# Patient Record
Sex: Female | Born: 1990 | Race: White | Hispanic: No | Marital: Single | Smoking: Never smoker
Health system: Southern US, Community
[De-identification: ages and names within clinical notes are randomized; demographics above are authoritative.]

## PROBLEM LIST (undated history)

## (undated) DIAGNOSIS — I10 Essential (primary) hypertension: Secondary | ICD-10-CM

## (undated) HISTORY — PX: ADENOIDECTOMY: SUR15

---

## 2011-12-26 ENCOUNTER — Ambulatory Visit: Payer: BC Managed Care – PPO | Admitting: Family Medicine

## 2011-12-26 VITALS — BP 120/75 | HR 67 | Temp 98.1°F | Resp 16 | Ht 68.5 in | Wt 212.4 lb

## 2011-12-26 DIAGNOSIS — L989 Disorder of the skin and subcutaneous tissue, unspecified: Secondary | ICD-10-CM

## 2011-12-26 DIAGNOSIS — R238 Other skin changes: Secondary | ICD-10-CM

## 2011-12-26 MED ORDER — HYDROXYZINE HCL 25 MG PO TABS: 25.0000 mg | ORAL_TABLET | Freq: Every evening | ORAL | Status: AC | PRN

## 2011-12-26 NOTE — Progress Notes (Signed)
  Subjective:    Patient ID: Isabel Brewer, female    DOB: 1991/06/10, 21 y.o.   MRN: [id number]  HPI 21 yo female with skin complaint. Tanned in tanning bed  Yesterday.  Stopped early, after 6 minutes, due to tingling feeling in her skin.  Has happened before when she's tanned.  Persisted yesterday - tingly and itchy.  Could't sleep.  UP all night because of it.  Took some benadryl today and has started to improve.  No rash.  No burn yet. Is fair skinned.  Has tanned before.  4th session this season.    Review of Systems Negative except as per HPI     Objective:   Physical Exam  Constitutional: She appears well-developed.  Pulmonary/Chest: Effort normal.  Neurological: She is alert.  Skin: Skin is warm. No rash noted. No erythema.          Assessment & Plan:  Skin paresthesias - UV reaction or possibly reaction to cleaning solution used on bed.  Advised against tanning.  Hydroxyzine if needed to help sleep tonight.  If persist past another 24 hours, follow-up.

## 2017-09-20 DIAGNOSIS — Z9889 Other specified postprocedural states: Secondary | ICD-10-CM

## 2017-09-20 HISTORY — DX: Other specified postprocedural states: Z98.890

## 2017-12-19 DIAGNOSIS — R87619 Unspecified abnormal cytological findings in specimens from cervix uteri: Secondary | ICD-10-CM

## 2017-12-19 HISTORY — DX: Unspecified abnormal cytological findings in specimens from cervix uteri: R87.619

## 2018-04-26 DIAGNOSIS — E282 Polycystic ovarian syndrome: Secondary | ICD-10-CM | POA: Insufficient documentation

## 2020-09-20 NOTE — L&D Delivery Note (Signed)
Delivery Note  Date of delivery: 12/30/2020 Estimated Date of Delivery: 02/03/21 Patient's last menstrual period was 04/09/2020. EGA: [redacted]w[redacted]d  Delivery Note At 2:20 PM a viable female was delivered via Vaginal, Spontaneous (Presentation:      LOA).  APGAR: 9, 10; weight  pending.  Placenta status: Spontaneous, Intact.  Cord: 3 vessels with the following complications: none.    First Stage: Labor onset: 0430 Augmentation : Pitocin Analgesia /Anesthesia intrapartum: Epidural SROM at 0430  Isabel Brewer presented to L&D with SROM and active labor. She was augmented with pitocin. Epidural placed.   Second Stage: Complete dilation at 1115 Onset of pushing at 1359 FHR second stage Cat II Delivery at 1420 on 12/30/2020  She progressed to complete and had a spontaneous vaginal birth of a live female over an intact perineum. The fetal head was delivered in OA position with restitution to LOA. No nuchal cord. Anterior then posterior shoulders delivered spontaneously. Baby placed on mom's abdomen and attended to by transition RN. Cord clamped and cut when pulseless by FOB. Cord blood obtained for newborn labs.  Third Stage: Placenta delivered intact with 3VC at 1424 Placenta disposition: Pathology Uterine tone firm / bleeding min IV pitocin given for hemorrhage prophylaxis  Anesthesia: Epidural Episiotomy: None Lacerations: 1st degree;Labial;Vaginal Suture Repair: 2.0 vicryl Est. Blood Loss (mL):   Complications: none  Mom to postpartum.  Baby to Couplet care / Skin to Skin.  Newborn: Birth Weight: pending  Apgar Scores: 9, 10 Feeding planned: Breast   Isabel Brewer, CNM 12/30/2020 2:45 PM

## 2020-12-30 ENCOUNTER — Inpatient Hospital Stay
Admission: EM | Admit: 2020-12-30 | Discharge: 2021-01-01 | DRG: 806 | Disposition: A | Discharge status: Home / Self Care | Payer: Medicaid Other

## 2020-12-30 ENCOUNTER — Inpatient Hospital Stay: Payer: Medicaid Other | Admitting: Anesthesiology

## 2020-12-30 ENCOUNTER — Other Ambulatory Visit: Payer: Self-pay

## 2020-12-30 ENCOUNTER — Encounter: Payer: Self-pay | Admitting: Obstetrics and Gynecology

## 2020-12-30 DIAGNOSIS — Z3A35 35 weeks gestation of pregnancy: Secondary | ICD-10-CM

## 2020-12-30 DIAGNOSIS — O99214 Obesity complicating childbirth: Secondary | ICD-10-CM | POA: Diagnosis present

## 2020-12-30 DIAGNOSIS — O1002 Pre-existing essential hypertension complicating childbirth: Principal | ICD-10-CM | POA: Diagnosis present

## 2020-12-30 DIAGNOSIS — O9081 Anemia of the puerperium: Secondary | ICD-10-CM | POA: Diagnosis not present

## 2020-12-30 DIAGNOSIS — O4700 False labor before 37 completed weeks of gestation, unspecified trimester: Secondary | ICD-10-CM | POA: Diagnosis present

## 2020-12-30 DIAGNOSIS — D62 Acute posthemorrhagic anemia: Secondary | ICD-10-CM | POA: Diagnosis not present

## 2020-12-30 DIAGNOSIS — Z20822 Contact with and (suspected) exposure to covid-19: Secondary | ICD-10-CM | POA: Diagnosis present

## 2020-12-30 DIAGNOSIS — O26893 Other specified pregnancy related conditions, third trimester: Secondary | ICD-10-CM | POA: Diagnosis present

## 2020-12-30 DIAGNOSIS — I10 Essential (primary) hypertension: Secondary | ICD-10-CM | POA: Diagnosis present

## 2020-12-30 DIAGNOSIS — O10919 Unspecified pre-existing hypertension complicating pregnancy, unspecified trimester: Secondary | ICD-10-CM | POA: Insufficient documentation

## 2020-12-30 HISTORY — DX: Essential (primary) hypertension: I10

## 2020-12-30 LAB — CHLAMYDIA/NGC RT PCR (ARMC ONLY)
Chlamydia Tr: NOT DETECTED
N gonorrhoeae: NOT DETECTED

## 2020-12-30 LAB — GROUP B STREP BY PCR: Group B strep by PCR: NEGATIVE

## 2020-12-30 LAB — COMPREHENSIVE METABOLIC PANEL
ALT: 12 U/L (ref 0–44)
AST: 19 U/L (ref 15–41)
Albumin: 3.1 g/dL — ABNORMAL LOW (ref 3.5–5.0)
Alkaline Phosphatase: 66 U/L (ref 38–126)
Anion gap: 12 (ref 5–15)
BUN: 5 mg/dL — ABNORMAL LOW (ref 6–20)
CO2: 20 mmol/L — ABNORMAL LOW (ref 22–32)
Calcium: 9.1 mg/dL (ref 8.9–10.3)
Chloride: 104 mmol/L (ref 98–111)
Creatinine, Ser: 0.5 mg/dL (ref 0.44–1.00)
GFR, Estimated: >60 mL/min (ref >60)
Glucose, Bld: 113 mg/dL — ABNORMAL HIGH (ref 70–99)
Potassium: 3.7 mmol/L (ref 3.5–5.1)
Sodium: 136 mmol/L (ref 135–145)
Total Bilirubin: 0.6 mg/dL (ref 0.3–1.2)
Total Protein: 7.3 g/dL (ref 6.5–8.1)

## 2020-12-30 LAB — RESP PANEL BY RT-PCR (FLU A&B, COVID) ARPGX2
Influenza A by PCR: NEGATIVE
Influenza B by PCR: NEGATIVE
SARS Coronavirus 2 by RT PCR: NEGATIVE

## 2020-12-30 LAB — WET PREP, GENITAL
Clue Cells Wet Prep HPF POC: NONE SEEN
Sperm: NONE SEEN
Trich, Wet Prep: NONE SEEN
Yeast Wet Prep HPF POC: NONE SEEN

## 2020-12-30 LAB — CBC
HCT: 34.4 % — ABNORMAL LOW (ref 36.0–46.0)
Hemoglobin: 11.4 g/dL — ABNORMAL LOW (ref 12.0–15.0)
MCH: 27.7 pg (ref 26.0–34.0)
MCHC: 33.1 g/dL (ref 30.0–36.0)
MCV: 83.7 fL (ref 80.0–100.0)
Platelets: 215 10*3/uL (ref 150–400)
RBC: 4.11 MIL/uL (ref 3.87–5.11)
RDW: 14.2 % (ref 11.5–15.5)
WBC: 19.2 10*3/uL — ABNORMAL HIGH (ref 4.0–10.5)
nRBC: 0 % (ref 0.0–0.2)

## 2020-12-30 LAB — RUPTURE OF MEMBRANE (ROM)PLUS: Rom Plus: NEGATIVE

## 2020-12-30 LAB — PROTEIN / CREATININE RATIO, URINE
Creatinine, Urine: 80 mg/dL
Protein Creatinine Ratio: 0.14 mg/mg{Cre} (ref 0.00–0.15)
Total Protein, Urine: 11 mg/dL

## 2020-12-30 LAB — TYPE AND SCREEN
ABO/RH(D): O POS
Antibody Screen: NEGATIVE

## 2020-12-30 LAB — ABO/RH: ABO/RH(D): O POS

## 2020-12-30 LAB — RPR: RPR Ser Ql: NONREACTIVE

## 2020-12-30 MED ORDER — SOD CITRATE-CITRIC ACID 500-334 MG/5ML PO SOLN: 30.0000 mL | ORAL | Status: DC | PRN

## 2020-12-30 MED ORDER — OXYTOCIN BOLUS FROM INFUSION
333.0000 mL | Freq: Once | INTRAVENOUS | Status: AC
  Administered 2020-12-30: 333 mL via INTRAVENOUS

## 2020-12-30 MED ORDER — NIFEDIPINE ER OSMOTIC RELEASE 30 MG PO TB24
30.0000 mg | ORAL_TABLET | Freq: Every day | ORAL | Status: DC
  Administered 2020-12-30 – 2021-01-01 (×3): 30 mg via ORAL
  Filled 2020-12-30 (×3): qty 1

## 2020-12-30 MED ORDER — FERROUS SULFATE 325 (65 FE) MG PO TABS
325.0000 mg | ORAL_TABLET | Freq: Two times a day (BID) | ORAL | Status: DC
  Administered 2020-12-31 – 2021-01-01 (×2): 325 mg via ORAL
  Filled 2020-12-30 (×2): qty 1

## 2020-12-30 MED ORDER — NIFEDIPINE 10 MG PO CAPS
30.0000 mg | ORAL_CAPSULE | Freq: Every morning | ORAL | Status: DC
  Filled 2020-12-30: qty 3

## 2020-12-30 MED ORDER — ONDANSETRON HCL 4 MG/2ML IJ SOLN: 4.0000 mg | Freq: Four times a day (QID) | INTRAMUSCULAR | Status: DC | PRN

## 2020-12-30 MED ORDER — DIBUCAINE (PERIANAL) 1 % EX OINT: 1.0000 "application " | TOPICAL_OINTMENT | CUTANEOUS | Status: DC | PRN

## 2020-12-30 MED ORDER — BUPIVACAINE HCL (PF) 0.25 % IJ SOLN
INTRAMUSCULAR | Status: DC | PRN
  Administered 2020-12-30 (×2): 4 mL via EPIDURAL

## 2020-12-30 MED ORDER — LACTATED RINGERS IV SOLN
500.0000 mL | Freq: Once | INTRAVENOUS | Status: AC
  Administered 2020-12-30: 500 mL via INTRAVENOUS

## 2020-12-30 MED ORDER — FENTANYL 2.5 MCG/ML W/ROPIVACAINE 0.15% IN NS 100 ML EPIDURAL (ARMC)
EPIDURAL | Status: AC
  Filled 2020-12-30: qty 100

## 2020-12-30 MED ORDER — PHENYLEPHRINE 40 MCG/ML (10ML) SYRINGE FOR IV PUSH (FOR BLOOD PRESSURE SUPPORT)
80.0000 ug | PREFILLED_SYRINGE | INTRAVENOUS | Status: DC | PRN
  Filled 2020-12-30: qty 10

## 2020-12-30 MED ORDER — CALCIUM CARBONATE ANTACID 500 MG PO CHEW: 2.0000 | CHEWABLE_TABLET | ORAL | Status: DC | PRN

## 2020-12-30 MED ORDER — OXYTOCIN-SODIUM CHLORIDE 30-0.9 UT/500ML-% IV SOLN
1.0000 m[IU]/min | INTRAVENOUS | Status: DC
  Administered 2020-12-30: 2 m[IU]/min via INTRAVENOUS

## 2020-12-30 MED ORDER — ACETAMINOPHEN 500 MG PO TABS: 1000.0000 mg | ORAL_TABLET | Freq: Four times a day (QID) | ORAL | Status: DC | PRN

## 2020-12-30 MED ORDER — LACTATED RINGERS IV SOLN
500.0000 mL | INTRAVENOUS | Status: DC | PRN
  Administered 2020-12-30: 500 mL via INTRAVENOUS

## 2020-12-30 MED ORDER — FENTANYL CITRATE (PF) 100 MCG/2ML IJ SOLN: 50.0000 ug | INTRAMUSCULAR | Status: DC | PRN

## 2020-12-30 MED ORDER — DIPHENHYDRAMINE HCL 50 MG/ML IJ SOLN: 12.5000 mg | INTRAMUSCULAR | Status: DC | PRN

## 2020-12-30 MED ORDER — OXYTOCIN 10 UNIT/ML IJ SOLN
INTRAMUSCULAR | Status: AC
  Filled 2020-12-30: qty 2

## 2020-12-30 MED ORDER — OXYCODONE HCL 5 MG PO TABS: 10.0000 mg | ORAL_TABLET | ORAL | Status: DC | PRN

## 2020-12-30 MED ORDER — SODIUM CHLORIDE 0.9 % IV SOLN
2.0000 g | Freq: Once | INTRAVENOUS | Status: AC
  Administered 2020-12-30: 2 g via INTRAVENOUS
  Filled 2020-12-30: qty 2000

## 2020-12-30 MED ORDER — FENTANYL 2.5 MCG/ML W/ROPIVACAINE 0.15% IN NS 100 ML EPIDURAL (ARMC)
12.0000 mL/h | EPIDURAL | Status: DC
Start: 2020-12-30 — End: 2020-12-30
  Administered 2020-12-30: 12 mL/h via EPIDURAL

## 2020-12-30 MED ORDER — OXYTOCIN-SODIUM CHLORIDE 30-0.9 UT/500ML-% IV SOLN
2.5000 [IU]/h | INTRAVENOUS | Status: DC
  Administered 2020-12-30 (×2): 2.5 [IU]/h via INTRAVENOUS
  Filled 2020-12-30 (×2): qty 500

## 2020-12-30 MED ORDER — DIPHENHYDRAMINE HCL 25 MG PO CAPS: 25.0000 mg | ORAL_CAPSULE | Freq: Four times a day (QID) | ORAL | Status: DC | PRN

## 2020-12-30 MED ORDER — EPHEDRINE 5 MG/ML INJ
10.0000 mg | INTRAVENOUS | Status: DC | PRN
  Filled 2020-12-30: qty 2

## 2020-12-30 MED ORDER — SODIUM CHLORIDE 0.9 % IV SOLN
1.0000 g | INTRAVENOUS | Status: DC
  Filled 2020-12-30 (×6): qty 1000

## 2020-12-30 MED ORDER — BENZOCAINE-MENTHOL 20-0.5 % EX AERO
1.0000 "application " | INHALATION_SPRAY | CUTANEOUS | Status: DC | PRN
  Administered 2020-12-30: 1 via TOPICAL
  Filled 2020-12-30: qty 56

## 2020-12-30 MED ORDER — TETANUS-DIPHTH-ACELL PERTUSSIS 5-2.5-18.5 LF-MCG/0.5 IM SUSY: 0.5000 mL | PREFILLED_SYRINGE | Freq: Once | INTRAMUSCULAR | Status: DC

## 2020-12-30 MED ORDER — WITCH HAZEL-GLYCERIN EX PADS: 1.0000 "application " | MEDICATED_PAD | CUTANEOUS | Status: DC | PRN

## 2020-12-30 MED ORDER — OXYCODONE HCL 5 MG PO TABS: 5.0000 mg | ORAL_TABLET | ORAL | Status: DC | PRN

## 2020-12-30 MED ORDER — ONDANSETRON HCL 4 MG PO TABS: 4.0000 mg | ORAL_TABLET | ORAL | Status: DC | PRN

## 2020-12-30 MED ORDER — MISOPROSTOL 200 MCG PO TABS
ORAL_TABLET | ORAL | Status: AC
  Filled 2020-12-30: qty 4

## 2020-12-30 MED ORDER — LIDOCAINE HCL (PF) 1 % IJ SOLN
INTRAMUSCULAR | Status: AC
  Filled 2020-12-30: qty 30

## 2020-12-30 MED ORDER — TERBUTALINE SULFATE 1 MG/ML IJ SOLN: 0.2500 mg | Freq: Once | INTRAMUSCULAR | Status: DC | PRN

## 2020-12-30 MED ORDER — LIDOCAINE-EPINEPHRINE (PF) 1.5 %-1:200000 IJ SOLN
INTRAMUSCULAR | Status: DC | PRN
  Administered 2020-12-30: 3 mL via PERINEURAL

## 2020-12-30 MED ORDER — DOCUSATE SODIUM 100 MG PO CAPS
100.0000 mg | ORAL_CAPSULE | Freq: Two times a day (BID) | ORAL | Status: DC
  Administered 2020-12-31 – 2021-01-01 (×3): 100 mg via ORAL
  Filled 2020-12-30 (×3): qty 1

## 2020-12-30 MED ORDER — ONDANSETRON HCL 4 MG/2ML IJ SOLN: 4.0000 mg | INTRAMUSCULAR | Status: DC | PRN

## 2020-12-30 MED ORDER — COCONUT OIL OIL: 1.0000 "application " | TOPICAL_OIL | Status: DC | PRN

## 2020-12-30 MED ORDER — LIDOCAINE HCL (PF) 1 % IJ SOLN
INTRAMUSCULAR | Status: DC | PRN
  Administered 2020-12-30: 3 mL

## 2020-12-30 MED ORDER — SIMETHICONE 80 MG PO CHEW: 80.0000 mg | CHEWABLE_TABLET | ORAL | Status: DC | PRN

## 2020-12-30 MED ORDER — AMMONIA AROMATIC IN INHA
RESPIRATORY_TRACT | Status: AC
  Filled 2020-12-30: qty 10

## 2020-12-30 MED ORDER — ACETAMINOPHEN 325 MG PO TABS: 650.0000 mg | ORAL_TABLET | ORAL | Status: DC | PRN

## 2020-12-30 MED ORDER — LACTATED RINGERS IV SOLN: INTRAVENOUS | Status: DC

## 2020-12-30 MED ORDER — IBUPROFEN 600 MG PO TABS
600.0000 mg | ORAL_TABLET | Freq: Four times a day (QID) | ORAL | Status: DC
  Administered 2020-12-30 – 2021-01-01 (×7): 600 mg via ORAL
  Filled 2020-12-30 (×7): qty 1

## 2020-12-30 MED ORDER — LIDOCAINE HCL (PF) 1 % IJ SOLN: 30.0000 mL | INTRAMUSCULAR | Status: DC | PRN

## 2020-12-30 MED ORDER — PRENATAL MULTIVITAMIN CH
1.0000 | ORAL_TABLET | Freq: Every day | ORAL | Status: DC
  Administered 2020-12-31 – 2021-01-01 (×2): 1 via ORAL
  Filled 2020-12-30 (×2): qty 1

## 2020-12-30 NOTE — Lactation Note (Signed)
This note was copied from a baby's chart. Lactation Consultation Note  Patient Name: Isabel Brewer [id number] Date: 12/30/2020 Reason for consult: L&D Initial assessment;1st time breastfeeding;Late-preterm 34-36.6wks Age:31 hours   Lactation at bedside in L&D to assist with first feeding post vaginal delivery to first time mom.Baby born at [redacted] weeks gestation, in football hold at R breast by Transition RN. Baby awake but not assertive at the breast. Mom has large heavy breasts with everted nipples, colostrum can easily be hand expressed.  In attempts to latch baby [facility] taught and demonstrated hand expression, and worked with baby at the breast. Baby could not sustain latch more than 3-4 sucks, possibly due to gestational age and size of mom's breasts. Towel placed under breast to help hold weight off of breasts, but baby still unable to maintain latch. LC continued to hand express colostrum into mouth, and then applied nipple shield. Baby accepted the breast well and sustained latch for 5 minutes with colostrum visible at end of feeding.  LC discussed with parents feeding patterns and behaviors of 35 week infants, discussed need for pumping post feedings/every 3 hours, and potential need for nipple shield while baby works to Chiropodist. Any expressed milk can be used as supplement if supplement is needed via pediatrician.  Parents verbalized understanding and on board with feeding plan: Put baby to breast on hand, hand express/pump every 2-3 hours, use of EBM post feeds as needed.  Pump to be set-up at bed side in MBU. Transition RN updated.   Maternal Data Has patient been taught Hand Expression?: Yes Does the patient have breastfeeding experience prior to this delivery?: No  Feeding Mother's Current Feeding Choice: Breast Milk  LATCH Score Latch: Repeated attempts needed to sustain latch, nipple held in mouth throughout feeding, stimulation needed to elicit  sucking reflex.  Audible Swallowing: A few with stimulation  Type of Nipple: Everted at rest and after stimulation  Comfort (Breast/Nipple): Soft / non-tender  Hold (Positioning): Assistance needed to correctly position infant at breast and maintain latch.  LATCH Score: 7   Lactation Tools Discussed/Used Tools: Nipple Shields Nipple shield size: 20  Interventions Interventions: Breast feeding basics reviewed;Assisted with latch;Skin to skin;Hand express;Breast massage;Breast compression;Adjust position;Education (nipple shield)  Discharge    Consult Status Consult Status: Follow-up Date: 12/30/20 Follow-up type: In-patient    Danford Bad 12/30/2020, 4:48 PM

## 2020-12-30 NOTE — Anesthesia Preprocedure Evaluation (Addendum)
Anesthesia Evaluation  Patient identified by MRN, date of birth, ID band Patient awake    Reviewed: Allergy & Precautions, H&P , NPO status , Patient's Chart, lab work & pertinent test results  Airway Mallampati: III  TM Distance: >3 FB Neck ROM: full    Dental no notable dental hx.    Pulmonary asthma ,    Pulmonary exam normal        Cardiovascular hypertension, Normal cardiovascular exam     Neuro/Psych negative neurological ROS  negative psych ROS   GI/Hepatic Neg liver ROS, GERD  ,  Endo/Other  negative endocrine ROS  Renal/GU negative Renal ROS  negative genitourinary   Musculoskeletal   Abdominal   Peds  Hematology negative hematology ROS (+)   Anesthesia Other Findings   Reproductive/Obstetrics (+) Pregnancy                            Anesthesia Physical Anesthesia Plan  ASA: II  Anesthesia Plan: Epidural   Post-op Pain Management:    Induction:   PONV Risk Score and Plan:   Airway Management Planned:   Additional Equipment:   Intra-op Plan:   Post-operative Plan:   Informed Consent: I have reviewed the patients History and Physical, chart, labs and discussed the procedure including the risks, benefits and alternatives for the proposed anesthesia with the patient or authorized representative who has indicated his/her understanding and acceptance.     Dental Advisory Given  Plan Discussed with: CRNA and Anesthesiologist  Anesthesia Plan Comments:         Anesthesia Quick Evaluation

## 2020-12-30 NOTE — Progress Notes (Signed)
Labor Progress Note  Isabel Brewer is a 30 y.o. G1P0 at [redacted]w[redacted]d by ultrasound admitted for augmentation after SROM.  Subjective: Assumed care.  Discussed POC with pt. She is comfortable with epidural.  Objective: BP (!) 112/50   Pulse (!) 111   Temp 98.3 F (36.8 C) (Oral)   Resp 18   Ht 5\' 10"  (1.778 m)   Wt 132.5 kg   LMP 04/09/2020   SpO2 97%   BMI 41.90 kg/m   Fetal Assessment: FHT:  FHR: 145 bpm, variability: moderate,  accelerations:  Present,  decelerations:  Absent Category/reactivity:  Category I UC:   regular, every 1-3 minutes SVE:    Dilation: 6 cm  Effacement: 80-90%  Station:  -1  Consistency: soft  Position: middle  Membrane status: SROM x 4 hours Amniotic color: clear  Labs: Lab Results  Component Value Date   WBC 19.2 (H) 12/30/2020   HGB 11.4 (L) 12/30/2020   HCT 34.4 (L) 12/30/2020   MCV 83.7 12/30/2020   PLT 215 12/30/2020    Assessment / Plan: Augmentation of labor due to SROM,  progressing well on pitocin - 62mU  Labor: Progressing on Pitocin, will continue to increase  Preeclampsia:  Chronic HTN, Currently on Procardia 30mg , last BP (after epidural) 112/50 Fetal Wellbeing:  Category I Pain Control:  Epidural I/D:  GBS neg, Afebrile, SROM x 4 hours Anticipated MOD:  NSVD  3m, CNM 12/30/2020, 8:36 AM

## 2020-12-30 NOTE — OB Triage Note (Addendum)
Pt is a 30y/o G1P0 at [redacted]w[redacted]d with c/o contractions that began Saturday and have gotten worse and every 6 minutes(since 2am) and LOF that began about 3am . Pt states +FM. Pt denies VB. Monitors applied and assessing. Initial [id number] .

## 2020-12-30 NOTE — Discharge Summary (Signed)
Obstetrical Discharge Summary  Patient Name: Isabel Brewer DOB: 11-Dec-1990 MRN: [id number]  Date of Admission: 12/30/2020 Date of Delivery: 12/30/20 Delivered by: Anselm Pancoast Date of Discharge: 01/01/2021  Primary OB: [facility]  [id number] last menstrual period was 04/09/2020. EDC Estimated Date of Delivery: 02/03/21 Gestational Age at Delivery: [redacted]w[redacted]d   Antepartum complications:  1. Chronic hypertension  2. Obesity in pregnancy  3. Elevated early 1 hour GTT - 3hr WNL  Admitting Diagnosis: Active labor Secondary Diagnosis: Patient Active Problem List   Diagnosis Date Noted  . Chronic hypertension affecting pregnancy 12/30/2020  . Chronic hypertension 12/30/2020  . NSVD (normal spontaneous vaginal delivery) 12/30/2020  . PCOS (polycystic ovarian syndrome) 04/26/2018    Augmentation: Pitocin Complications: None  Intrapartum complications/course: see delivery notes Delivery Type: spontaneous vaginal delivery Anesthesia: epidural Placenta: spontaneous Laceration: 1st degree and labial Episiotomy: none Newborn Data: Live born female  Birth Weight: 5#13 APGAR: 38, 10  Newborn Delivery   Birth date/time: 12/30/2020 14:20:00 Delivery type: Vaginal, Spontaneous      Postpartum Procedures: none  Post partum course:  Patient had an uncomplicated postpartum course.  By time of discharge on PPD#2, her pain was controlled on oral pain medications; she had appropriate lochia and was ambulating, voiding without difficulty and tolerating regular diet.  She was deemed stable for discharge to home.    Discharge Physical Exam:  BP 131/81 (BP Location: Left Arm)   Pulse 80   Temp 98.3 F (36.8 C)   Resp 17   Ht 5\' 10"  (1.778 m)   Wt 132.5 kg   LMP 04/09/2020   SpO2 98%   Breastfeeding Unknown   BMI 41.90 kg/m   General: alert and no distress Pulm: normal respiratory effort Lochia: appropriate Abdomen: soft, NT Uterine Fundus: firm, below  umbilicus Perineum: minimal edema, laceration repair well approximated Lochia: small Extremities: No evidence of DVT seen on physical exam. No lower extremity edema.  Edinburgh07/23/2021 Postnatal Depression Scale Screening Tool 12/31/2020  I have been able to laugh and see the funny side of things. 0  I have looked forward with enjoyment to things. 0  I have blamed myself unnecessarily when things went wrong. 2  I have been anxious or worried for no good reason. 2  I have felt scared or panicky for no good reason. 0  Things have been getting on top of me. 1  I have been so unhappy that I have had difficulty sleeping. 0  I have felt sad or miserable. 1  I have been so unhappy that I have been crying. 0  The thought of harming myself has occurred to me. 0  Edinburgh Postnatal Depression Scale Total 6     Labs: CBC Latest Ref Rng & Units 12/31/2020 12/30/2020  WBC 4.0 - 10.5 K/uL 14.9(H) 19.2(H)  Hemoglobin 12.0 - 15.0 g/dL 10.1(L) 11.4(L)  Hematocrit 36.0 - 46.0 % 30.1(L) 34.4(L)  Platelets 150 - 400 K/uL 191 215   O POS Performed at [facility], [address] [place], [place]  Hemoglobin  Date Value Ref Range Status  12/31/2020 10.1 (L) 12.0 - 15.0 g/dL Final   HCT  Date Value Ref Range Status  12/31/2020 30.1 (L) 36.0 - 46.0 % Final    Disposition: stable, discharge to home Baby Feeding: breastmilk Baby Disposition: home with mom  Contraception: Considering vasectomy  Prenatal Labs:  Blood type/Rh O pos  Antibody screen neg  Rubella Immune  Varicella Immune  RPR NR  HBsAg Neg  HIV NR  GC neg  Chlamydia neg  Genetic screening negative  1 hour GTT 147 (first trimester)   3 hour GTT 1st trimester - 99, 143, 130, 108 28 weeks - 102, 165, 133, 61  GBS Pending - collected 12/30/2020   Rh Immune globulin given: n/a Rubella vaccine given: Immune Varicella vaccine given: Immune Tdap vaccine given in AP or PP setting: 12/03/2020 Flu vaccine  given in AP or PP setting: 08/25/2020  Plan: Caryl Bis was discharged to home in good condition. Follow-up appointment with delivering provider in 6 weeks.  Discharge Instructions: Per After Visit Summary. Activity: Advance as tolerated. Pelvic rest for 6 weeks.   Diet: Regular Discharge Medications: Allergies as of 01/01/2021   No Known Allergies     Medication List    STOP taking these medications   aspirin 81 MG chewable tablet     TAKE these medications   benzocaine-Menthol 20-0.5 % Aero Commonly known as: DERMOPLAST Apply 1 application topically as needed for irritation (perineal discomfort).   coconut oil Oil Apply 1 application topically as needed.   docusate sodium 100 MG capsule Commonly known as: COLACE Take 1 capsule (100 mg total) by mouth 2 (two) times daily.   ferrous sulfate 325 (65 FE) MG tablet Take 1 tablet (325 mg total) by mouth 2 (two) times daily with a meal.   ibuprofen 600 MG tablet Commonly known as: ADVIL Take 1 tablet (600 mg total) by mouth every 6 (six) hours.   multivitamin-prenatal 27-0.8 MG Tabs tablet Take 1 tablet by mouth daily at 12 noon.   NIFEdipine 30 MG 24 hr tablet Commonly known as: ADALAT CC Take 30 mg by mouth daily.   simethicone 80 MG chewable tablet Commonly known as: MYLICON Chew 1 tablet (80 mg total) by mouth as needed for flatulence.   witch hazel-glycerin pad Commonly known as: TUCKS Apply 1 application topically as needed for hemorrhoids.      Outpatient follow up:   Follow-up Information    Haroldine Laws, CNM. Schedule an appointment as soon as possible for a visit in 6 week(s).   Specialty: Certified Nurse Midwife Why: routine postpartum Contact information: [address] [place] [address] [phone]               Signed: Randa Ngo, CNM  01/01/2021 11:24 AM

## 2020-12-30 NOTE — H&P (Signed)
OB History & Physical   History of Present Illness:   Chief Complaint: contractions and LOF  HPI:  Reghan Thul is a 30 y.o. G1P0 female at [redacted]w[redacted]d dated by [place] at [redacted]w[redacted]d.  She presents to L&D for contractions that have become more regular and LOF that started at 0300  Reports active fetal movement  Contractions: every 3 to 5 minutes  Starting at 0200 LOF/SROM: reports a gush of fluid that started at 0300 Vaginal bleeding: denies   Pregnancy Issues: 1. Chronic hypertension  2. Obesity in pregnancy  3. Elevated early 1 hour GTT - 3hr WNL  Patient Active Problem List   Diagnosis Date Noted  . Preterm contractions 12/30/2020  . Preterm labor 12/30/2020  . Chronic hypertension affecting pregnancy 12/30/2020  . PCOS (polycystic ovarian syndrome) 04/26/2018     Maternal Medical History:   Past Medical History:  Diagnosis Date  . Abnormal Pap smear of cervix 12/2017  . Chronic hypertension   . History of colposcopy 2019    Past Surgical History:  Procedure Laterality Date  . ADENOIDECTOMY      No Known Allergies  Prior to Admission medications   Medication Sig Start Date End Date Taking? Authorizing Provider  aspirin 81 MG chewable tablet Chew by mouth daily.   Yes [provider]  NIFEdipine (ADALAT CC) 30 MG 24 hr tablet Take 30 mg by mouth daily.   Yes [provider]  Prenatal Vit-Fe Fumarate-FA (MULTIVITAMIN-PRENATAL) 27-0.8 MG TABS tablet Take 1 tablet by mouth daily at 12 noon.   Yes [provider]     Prenatal care site:  [facility]  Social History: She  reports that she has never smoked. She has never used smokeless tobacco. She reports previous alcohol use. She reports that she does not use drugs.  Family History: family history includes Alcohol abuse in her father; Colon cancer in her maternal grandmother; Diabetes Mellitus II in her mother; Hypertension in her mother; Obesity in her mother; Uterine cancer in her  mother.   Review of Systems: A full review of systems was performed and negative except as noted in the HPI.     Physical Exam:  Vital Signs: BP (!) 143/79   Pulse (!) 104   Temp 98.3 F (36.8 C) (Oral)   Resp 18   Ht 5\' 10"  (1.778 m)   Wt 132.5 kg   LMP 04/09/2020   BMI 41.90 kg/m  Physical Exam  General: no acute distress.  HEENT: normocephalic, atraumatic Heart: regular rate & rhythm.  No murmurs/rubs/gallops Lungs: clear to auscultation bilaterally, normal respiratory effort Abdomen: soft, gravid, non-tender;   EFW: 2163g, 4lbs12oz, 58% on 12/11/2020 Pelvic:   External: Normal external female genitalia  Cervix: Dilation: 5.5 / Effacement (%): 90 / Station: Plus 1    Extremities: non-tender, symmetric, no edema bilaterally.  DTRs: 2+/2+  Neurologic: Alert & oriented x 3.    No results found for this or any previous visit (from the past 24 hour(s)).  Pertinent Results:  Prenatal Labs: Blood type/Rh O pos  Antibody screen neg  Rubella Immune  Varicella Immune  RPR NR  HBsAg Neg  HIV NR  GC neg  Chlamydia neg  Genetic screening negative  1 hour GTT 147 (first trimester)   3 hour GTT 1st trimester - 99, 143, 130, 108 28 weeks - 102, 165, 133, 61  GBS Pending - collected 12/30/2020   FHT: Baseline: 135 bpm, Variability: moderate, Accelerations: present and Decelerations: Absent TOCO: regular,  every 2-3 minutes SVE:  Dilation: 5.5 / Effacement (%): 90 / Station: Plus 1    Cephalic by Leopolds and SVE  -US performed yesterday, cephalic on [place]   No results found.  Assessment:  Vidhi Delellis is a 30 y.o. G1P0 female at [redacted]w[redacted]d with PTL and SROM.   Plan:  1. Admit to Labor & Delivery; consents reviewed and obtained - Covid admission screen   2. Fetal Well being  - Fetal Tracing: cat 1 - Group B Streptococcus ppx indicated: Pending, will start ampicillin for prophylaxis until PCR has resulted - Presentation: cehpalic confirmed by SVE   3. Routine OB: -  Prenatal labs reviewed, as above - Rh pos - CBC, T&S, RPR on admit - Clear fluids, IVF  4. Monitoring of labor  -  Contractions monitored with external toco -  Pelvis adequate for trial of labor  -  Plan for expectant management  -  Plan for  continuous fetal monitoring -  Maternal pain control as desired; planning regional anesthesia - Anticipate vaginal delivery  5. Post Partum Planning: - Infant feeding: breast - Contraception: Considering vasectomy - Tdap vaccine: given 12/03/2020 - Flu vaccine: given 08/25/2020  Gustavo Lah, CNM 12/30/20 5:57 AM  Margaretmary Eddy, CNM Certified Nurse Midwife [facility]

## 2020-12-30 NOTE — Progress Notes (Signed)
Labor Progress Note  Isabel Brewer is a 30 y.o. G1P0 at [redacted]w[redacted]d by ultrasound admitted for augmentation after SROM.  Subjective: Doing well, comfortable with epidural.  Objective: BP 119/79   Pulse 94   Temp 98.1 F (36.7 C) (Oral)   Resp 18   Ht 5\' 10"  (1.778 m)   Wt 132.5 kg   LMP 04/09/2020   SpO2 96%   BMI 41.90 kg/m   Fetal Assessment: FHT:  FHR: 135 bpm, variability: moderate,  accelerations:  Abscent,  decelerations:  Present Lates - resolved with position change, fluid bolus, and d/c pit Category/reactivity:  Category II UC:   regular, every 1-3 minutes SVE:    Dilation: 10 cm  Effacement: 100%  Station:  +1  Consistency: ---  Position: ---  Membrane status: SROM at 0430 Amniotic color: clear  Labs: Lab Results  Component Value Date   WBC 19.2 (H) 12/30/2020   HGB 11.4 (L) 12/30/2020   HCT 34.4 (L) 12/30/2020   MCV 83.7 12/30/2020   PLT 215 12/30/2020    Assessment / Plan: Augmentation of labor due to SROM,  progressing well on pitocin  Labor: Progressing on Pitocin, will continue to increase  Preeclampsia:  Chronic HTN, Currently on Procardia 30mg , last BP (after epidural) 112/50 Fetal Wellbeing:  Category II Pitocin turned off and IUPC placed Pain Control:  Epidural I/D:  GBS neg, Afebrile, SROM x 7 hours Anticipated MOD:  NSVD  03/01/2021, CNM 12/30/2020, 11:46 AM

## 2020-12-30 NOTE — Plan of Care (Signed)
Pt progressing with all general goals- labor and delivery goals and education met.

## 2020-12-30 NOTE — Anesthesia Procedure Notes (Addendum)
Epidural Patient location during procedure: OB Start time: 12/30/2020 7:39 AM  Staffing Anesthesiologist: Piscitello, Cleda Mccreedy, MD Resident/CRNA: Irving Burton, CRNA Performed: resident/CRNA   Preanesthetic Checklist Completed: patient identified, IV checked, site marked, risks and benefits discussed, surgical consent, monitors and equipment checked, pre-op evaluation and timeout performed  Epidural Patient position: sitting Prep: ChloraPrep Patient monitoring: heart rate, continuous pulse ox and blood pressure Approach: midline Location: L3-L4 Injection technique: LOR saline  Needle:  Needle type: Tuohy  Needle gauge: 17 G Needle length: 9 cm and 9 Needle insertion depth: 8 cm Catheter type: closed end flexible Catheter size: 19 Gauge Catheter at skin depth: 13 cm Test dose: negative and 1.5% lidocaine with Epi 1:200 K  Assessment Sensory level: T10 Events: blood not aspirated, injection not painful, no injection resistance, no paresthesia and negative IV test  Additional Notes 1 attempt Pt. Evaluated and documentation done after procedure finished. Patient identified. Risks/Benefits/Options discussed with patient including but not limited to bleeding, infection, nerve damage, paralysis, failed block, incomplete pain control, headache, blood pressure changes, nausea, vomiting, reactions to medication both or allergic, itching and postpartum back pain. Confirmed with bedside nurse the patient's most recent platelet count. Confirmed with patient that they are not currently taking any anticoagulation, have any bleeding history or any family history of bleeding disorders. Patient expressed understanding and wished to proceed. All questions were answered. Sterile technique was used throughout the entire procedure. Please see nursing notes for vital signs. Test dose was given through epidural catheter and negative prior to continuing to dose epidural or start infusion. Warning  signs of high block given to the patient including shortness of breath, tingling/numbness in hands, complete motor block, or any concerning symptoms with instructions to call for help. Patient was given instructions on fall risk and not to get out of bed. All questions and concerns addressed with instructions to call with any issues or inadequate analgesia.   Patient tolerated the insertion well without immediate complications.Reason for block:procedure for pain

## 2020-12-31 LAB — CBC
HCT: 30.1 % — ABNORMAL LOW (ref 36.0–46.0)
Hemoglobin: 10.1 g/dL — ABNORMAL LOW (ref 12.0–15.0)
MCH: 28.1 pg (ref 26.0–34.0)
MCHC: 33.6 g/dL (ref 30.0–36.0)
MCV: 83.6 fL (ref 80.0–100.0)
Platelets: 191 10*3/uL (ref 150–400)
RBC: 3.6 MIL/uL — ABNORMAL LOW (ref 3.87–5.11)
RDW: 14.5 % (ref 11.5–15.5)
WBC: 14.9 10*3/uL — ABNORMAL HIGH (ref 4.0–10.5)
nRBC: 0 % (ref 0.0–0.2)

## 2020-12-31 NOTE — Lactation Note (Signed)
This note was copied from a baby's chart. Lactation Consultation Note  Patient Name: Isabel Brewer Date: 12/31/2020 Reason for consult: Follow-up assessment;1st time breastfeeding;Late-preterm 34-36.6wks;Infant < 6lbs Age:31 hours  Lactation at bedside to assist with feeding for late preterm infant. Feeding plan to include breastfeeding, and providing 59mL EBM+formula(22 kcal).  LC assisted with position/latch of infant in football hold on R breast. Nipple shield placed. After a few attempts baby did accept nipple shield a few non-nutritive sucks identified at first. LC worked to massage and compress the breast tissue to move colostrum into the shield. Throughout 10 minutes at the breast 1 audible swallow was identified. Baby had brief periods of stronger suck pattern but did not last more than 3 sucks before returning to light/non-nutritive suck.  After 10 minutes, baby remained in side-lying position with pillow support for mom to offer supplement via bottle. Baby tolerated the feed well over 5 minutes.   LC and RN praised mom for baby's progress and moms dedication to feeding and providing for her baby.   Encouraged current feeding practices and behaviors, and continuation of pumping routine and process; reiterating the importance and reason behind consistency.  Encouraged to call for support or with questions/concerns.  Maternal Data Has patient been taught Hand Expression?: Yes Does the patient have breastfeeding experience prior to this delivery?: No  Feeding Mother's Current Feeding Choice: Breast Milk Nipple Type: Slow - flow  LATCH Score Latch: Repeated attempts needed to sustain latch, nipple held in mouth throughout feeding, stimulation needed to elicit sucking reflex.  Audible Swallowing: None (1 swallow noted throughout 10 minutes)  Type of Nipple: Everted at rest and after stimulation  Comfort (Breast/Nipple): Soft / non-tender  Hold (Positioning):  Assistance needed to correctly position infant at breast and maintain latch.  LATCH Score: 6   Lactation Tools Discussed/Used Tools: Pump;Nipple Shields Nipple shield size: 20 Breast pump type: Double-Electric Breast Pump Reason for Pumping: 35wk  Interventions Interventions: Breast feeding basics reviewed;Assisted with latch;Hand express;Breast massage;Breast compression;Support pillows;Education  Discharge    Consult Status Consult Status: Follow-up Date: 12/31/20 Follow-up type: In-patient    Danford Bad 12/31/2020, 2:47 PM

## 2020-12-31 NOTE — Progress Notes (Signed)
Post Partum Day 1 Subjective: Doing well, no complaints.  Tolerating regular diet, pain with PO meds, voiding and ambulating without difficulty.  No CP SOB Fever,Chills, N/V or leg pain; denies nipple or breast pain, no HA change of vision, RUQ/epigastric pain  Objective: BP 130/84 (BP Location: Left Arm)   Pulse 85   Temp (!) 97.5 F (36.4 C)   Resp 18   Ht 5\' 10"  (1.778 m)   Wt 132.5 kg   LMP 04/09/2020   SpO2 99%   Breastfeeding Unknown   BMI 41.90 kg/m    Physical Exam:  General: NAD Breasts: soft/nontender CV: RRR Pulm: nl effort, CTABL Abdomen: soft, NT, BS x 4 Perineum: minimal edema, laceration repair well approximated Lochia: small Uterine Fundus: fundus firm and 2 fb below umbilicus DVT Evaluation: no cords, ttp LEs   Recent Labs    12/30/20 0542 12/31/20 0518  HGB 11.4* 10.1*  HCT 34.4* 30.1*  WBC 19.2* 14.9*  PLT 215 191    Assessment/Plan: 30 y.o. G1P0101 postpartum day # 1  - Continue routine PP care - Lactation consult prn.  - Discussed contraceptive options including implant, IUDs hormonal and non-hormonal, injection, pills/ring/patch, condoms, and NFP. Undecided on birth control - Acute blood loss anemia - hemodynamically stable and asymptomatic; start po ferrous sulfate BID with stool softeners  - Immunization status: Needs all Imms up to date    Disposition: Does not desire Dc home today.     01/02/21, CNM 12/31/2020  3:19 PM

## 2020-12-31 NOTE — Lactation Note (Signed)
This note was copied from a baby's chart. Lactation Consultation Note  Patient Name: Isabel Brewer [facility] Date: 12/31/2020 Reason for consult: Follow-up assessment;Late-preterm 34-36.6wks;1st time breastfeeding Age:31 hours  Lactation at bedside to assist with attempted feeding and milk expression. Baby born at 35 weeks has had trouble maintaining body temperature, but has stable blood sugars. Preterm feeding protocol has been implemented and mom has initiated pumping once settled on MBU.   Baby has been receiving EBM via curved tip syringe at breast with help from the nursing staff overnight.   LC talked with parents about goals and feeding plan for today. Today we are going to be working on feeding/alertness at the breast and then supplementing with at least 58mL of EBM or Neosure 22kcal or a combination to equal 72mL.   With baby skin to skin with mom LC worked to attempt a feeding, baby remained sleepy and uninterested. After 10 minutes of attempting efforts were discontinued, and baby was handed to dad for skin to skin while mom pumped.  LC assisted mom with hand expression/breast stimulation and set-up of pump and then started the pumping process.  Baby taken to [facility] to obtain blood cultures per Neo's recommendation.  Encouraged to call out for support with each feeding, to ask questions, and to rest when able.   Maternal Data Has patient been taught Hand Expression?: Yes Does the patient have breastfeeding experience prior to this delivery?: No  Feeding Mother's Current Feeding Choice: Breast Milk  LATCH Score Latch: Too sleepy or reluctant, no latch achieved, no sucking elicited.  Audible Swallowing: None  Type of Nipple: Everted at rest and after stimulation  Comfort (Breast/Nipple): Soft / non-tender  Hold (Positioning): Assistance needed to correctly position infant at breast and maintain latch.  LATCH Score: 5   Lactation Tools Discussed/Used Tools:  Pump;Nipple Shields;26F feeding tube / Syringe Nipple shield size: 20 Breast pump type: Double-Electric Breast Pump Pump Education: Setup, frequency, and cleaning;Milk Storage Reason for Pumping: 35wk Pumping frequency: q 3hrs Pumped volume: 3 mL  Interventions Interventions: Breast feeding basics reviewed;Assisted with latch;Skin to skin;Hand express;Adjust position;Support pillows;Education;DEBP  Discharge    Consult Status Consult Status: Follow-up Date: 12/31/20 Follow-up type: In-patient    Danford Bad 12/31/2020, 11:07 AM

## 2020-12-31 NOTE — Anesthesia Postprocedure Evaluation (Signed)
Anesthesia Post Note  Patient: Isabel Brewer  Procedure(s) Performed: AN AD HOC LABOR EPIDURAL  Patient location during evaluation: Mother Baby Anesthesia Type: Epidural Level of consciousness: awake and alert Pain management: pain level controlled Vital Signs Assessment: post-procedure vital signs reviewed and stable Respiratory status: spontaneous breathing, nonlabored ventilation and respiratory function stable Cardiovascular status: stable Postop Assessment: no headache, no backache and epidural receding Anesthetic complications: no   No complications documented.   Last Vitals:  Vitals:   12/31/20 0752 12/31/20 1133  BP: (!) 141/76 130/84  Pulse: 88 85  Resp: 20 18  Temp: 36.6 C (!) 36.4 C  SpO2: 98% 99%    Last Pain:  Vitals:   12/31/20 0752  TempSrc: Oral  PainSc:                  Karoline Caldwell

## 2021-01-01 LAB — SURGICAL PATHOLOGY

## 2021-01-01 MED ORDER — SIMETHICONE 80 MG PO CHEW: 80.0000 mg | CHEWABLE_TABLET | ORAL | 0 refills | Status: AC | PRN

## 2021-01-01 MED ORDER — IBUPROFEN 600 MG PO TABS: 600.0000 mg | ORAL_TABLET | Freq: Four times a day (QID) | ORAL | 0 refills | Status: AC

## 2021-01-01 MED ORDER — BENZOCAINE-MENTHOL 20-0.5 % EX AERO: 1.0000 "application " | INHALATION_SPRAY | CUTANEOUS | Status: AC | PRN

## 2021-01-01 MED ORDER — FERROUS SULFATE 325 (65 FE) MG PO TABS: 325.0000 mg | ORAL_TABLET | Freq: Two times a day (BID) | ORAL | 0 refills | Status: AC

## 2021-01-01 MED ORDER — WITCH HAZEL-GLYCERIN EX PADS: 1.0000 "application " | MEDICATED_PAD | CUTANEOUS | 12 refills | Status: AC | PRN

## 2021-01-01 MED ORDER — DOCUSATE SODIUM 100 MG PO CAPS: 100.0000 mg | ORAL_CAPSULE | Freq: Two times a day (BID) | ORAL | 0 refills | Status: AC

## 2021-01-01 MED ORDER — COCONUT OIL OIL: 1.0000 "application " | TOPICAL_OIL | 0 refills | Status: AC | PRN

## 2021-01-01 NOTE — Discharge Instructions (Signed)
Postpartum Care After Vaginal Delivery The following information offers guidance about how to care for yourself from the time you deliver your baby to 6-12 weeks after delivery (postpartum period). If you have problems or questions, contact your health care provider for more specific instructions. Follow these instructions at home: Vaginal bleeding  It is normal to have vaginal bleeding (lochia) after delivery. Wear a sanitary pad for bleeding and discharge. ? During the first week after delivery, the amount and appearance of lochia is often similar to a menstrual period. ? Over the next few weeks, it will gradually decrease to a dry, yellow-brown discharge. ? For most women, lochia stops completely by 4-6 weeks after delivery, but can vary.  Change your sanitary pads frequently. Watch for any changes in your flow, such as: ? A sudden increase in volume. ? A change in color. ? Large blood clots.  If you pass a blood clot from your vagina, save it and call your health care provider. Do not flush blood clots down the toilet before talking with your health care provider.  Do not use tampons or douches until your health care provider approves.  If you are not breastfeeding, your period should return 6-8 weeks after delivery. If you are feeding your baby breast milk only, your period may not return until you stop breastfeeding. Perineal care  Keep the area between the vagina and the anus (perineum) clean and dry. Use medicated pads and pain-relieving sprays and creams as directed.  If you had a surgical cut in the perineum (episiotomy) or a tear, check the area for signs of infection until you are healed. Check for: ? More redness, swelling, or pain. ? Fluid or blood coming from the cut or tear. ? Warmth. ? Pus or a bad smell.  You may be given a squirt bottle to use instead of wiping to clean the perineum area after you use the bathroom. Pat the area gently to dry it.  To relieve pain  caused by an episiotomy, a tear, or swollen veins in the anus (hemorrhoids), take a warm sitz bath 2-3 times a day. In a sitz bath, the warm water should only come up to your hips and cover your buttocks.   Breast care  In the first few days after delivery, your breasts may feel heavy, full, and uncomfortable (breast engorgement). Milk may also leak from your breasts. Ask your health care provider about ways to help relieve the discomfort.  If you are breastfeeding: ? Wear a bra that supports your breasts and fits well. Use breast pads to absorb milk that leaks. ? Keep your nipples clean and dry. Apply creams and ointments as told. ? You may have uterine contractions every time you breastfeed for up to several weeks after delivery. This helps your uterus return to its normal size. ? If you have any problems with breastfeeding, notify your health care provider or lactation consultant.  If you are not breastfeeding: ? Avoid touching your breasts. Do not squeeze out (express) milk. Doing this can make your breasts produce more milk. ? Wear a good-fitting bra and use cold packs to help with swelling. Intimacy and sexuality  Ask your health care provider when you can engage in sexual activity. This may depend upon: ? Your risk of infection. ? How fast you are healing. ? Your comfort and desire to engage in sexual activity.  You are able to get pregnant after delivery, even if you have not had your period. Talk with   your health care provider about methods of birth control (contraception) or family planning if you desire future pregnancies. Medicines  Take over-the-counter and prescription medicines only as told by your health care provider.  Take an over-the-counter stool softener to help ease bowel movements as told by your health care provider.  If you were prescribed an antibiotic medicine, take it as told by your health care provider. Do not stop taking the antibiotic even if you start to  feel better.  Review all previous and current prescriptions to check for possible transfer into breast milk. Activity  Gradually return to your normal activities as told by your health care provider.  Rest as much as possible. Nap while your baby is sleeping. Eating and drinking  Drink enough fluid to keep your urine pale yellow.  To help prevent or relieve constipation, eat high-fiber foods every day.  Choose healthy eating to support breastfeeding or weight loss goals.  Take your prenatal vitamins until your health care provider tells you to stop.   General tips/recommendations  Do not use any products that contain nicotine or tobacco. These products include cigarettes, chewing tobacco, and vaping devices, such as e-cigarettes. If you need help quitting, ask your health care provider.  Do not drink alcohol, especially if you are breastfeeding.  Do not take medications or drugs that are not prescribed to you, especially if you are breastfeeding.  Visit your health care provider for a postpartum checkup within the first 3-6 weeks after delivery.  Complete a comprehensive postpartum visit no later than 12 weeks after delivery.  Keep all follow-up visits for you and your baby. Contact a health care provider if:  You feel unusually sad or worried.  Your breasts become red, painful, or hard.  You have a fever or other signs of an infection.  You have bleeding that is soaking through one pad an hour or you have blood clots.  You have a severe headache that doesn't go away or you have vision changes.  You have nausea and vomiting and are unable to eat or drink anything for 24 hours. Get help right away if:  You have chest pain or difficulty breathing.  You have sudden, severe leg pain.  You faint or have a seizure.  You have thoughts about hurting yourself or your baby. If you ever feel like you may hurt yourself or others, or have thoughts about taking your own life,  get help right away. Go to your nearest emergency department or:  Call your local emergency services (911 in the [place]  The National Suicide Prevention Lifeline at 1-800-273-8255. This suicide crisis helpline is open 24 hours a day.  Text the Crisis Text Line at 741741 (in the [place] Summary  The period of time after you deliver your newborn up to 6-12 weeks after delivery is called the postpartum period.  Keep all follow-up visits for you and your baby.  Review all previous and current prescriptions to check for possible transfer into breast milk.  Contact a health care provider if you feel unusually sad or worried during the postpartum period. This information is not intended to replace advice given to you by your health care provider. Make sure you discuss any questions you have with your health care provider. Document Revised: 05/22/2020 Document Reviewed: 05/22/2020 Else[employer] Patient Education  2021 [employer] Postpartum Baby Blues The postpartum period begins right after the birth of a baby. During this time, there is often joy and excitement. It is also a   time of many changes in the life of the parents. A mother may feel happy one minute and sad or stressed the next. These feelings of sadness, called the baby blues, usually happen in the period right after the baby is born and go away within a week or two. What are the causes? The exact cause of this condition is not known. Changes in hormone levels after childbirth are believed to trigger some of the symptoms. Other factors that can play a role in these mood changes include:  Lack of sleep.  Stressful life events, such as financial problems, caring for a loved one, or death of a loved one.  Genetics. What are the signs or symptoms? Symptoms of this condition include:  Changes in mood, such as going from extreme happiness to sadness.  A decrease in concentration.  Difficulty sleeping.  Crying spells and  tearfulness.  Loss of appetite.  Irritability.  Anxiety. If these symptoms last for more than 2 weeks or become more severe, you may have postpartum depression. How is this diagnosed? This condition is diagnosed based on an evaluation of your symptoms. Your health care provider may use a screening tool that includes a list of questions to help identify a person with the baby blues or postpartum depression. How is this treated? The baby blues usually go away on their own in 1-2 weeks. Social support is often what is needed. You will be encouraged to get adequate sleep and rest. Follow these instructions at home: Lifestyle  Get as much rest as you can. Take a nap when the baby sleeps.  Exercise regularly as told by your health care provider. Some women find yoga and walking to be helpful.  Eat a balanced and nourishing diet. This includes plenty of fruits and vegetables, whole grains, and lean proteins.  Do little things that you enjoy. Take a bubble bath, read your favorite magazine, or listen to your favorite music.  Avoid alcohol.  Ask for help with household chores, cooking, grocery shopping, or running errands. Do not try to do everything yourself. Consider hiring a postpartum doula to help. This is a professional who specializes in providing support to new mothers.  Try not to make any major life changes during pregnancy or right after giving birth. This can add stress.      General instructions  Talk to people close to you about how you are feeling. Get support from your partner, family members, friends, or other new moms. You may want to join a support group.  Find ways to manage stress. This may include: ? Writing your thoughts and feelings in a journal. ? Spending time outside. ? Spending time with people who make you laugh.  Try to stay positive in how you think. Think about the things you are grateful for.  Take over-the-counter and prescription medicines only as  told by your health care provider.  Let your health care provider know if you have any concerns.  Keep all postpartum visits. This is important. Contact a health care provider if:  Your baby blues do not go away after 2 weeks. Get help right away if:  You have thoughts of taking your own life (suicidal thoughts), or of harming your baby or someone else.  You see or hear things that are not there (hallucinations). If you ever feel like you may hurt yourself or others, or have thoughts about taking your own life, get help right away. Go to your nearest emergency department or:  Call   your local emergency services (911 in the [place]  Call a suicide crisis helpline, such as the National Suicide Prevention Lifeline, at 1-800-273-8255. This is open 24 hours a day in the [place]  Text the Crisis Text Line at 741741 (in the [place] Summary  After giving birth, you may feel happy one minute and sad or stressed the next. Feelings of sadness that happen right after the baby is born and go away after a week or two are called the baby blues.  You can manage the baby blues by getting enough rest, eating a healthy diet, exercising, spending time with supportive people, and finding ways to manage stress.  If feelings of sadness and stress last longer than 2 weeks or get in the way of caring for your baby, talk with your health care provider. This may mean you have postpartum depression. This information is not intended to replace advice given to you by your health care provider. Make sure you discuss any questions you have with your health care provider. Document Revised: 02/29/2020 Document Reviewed: 02/29/2020 Elsevier Patient Education  2021 [employer] Breastfeeding Tips for a Good Latch Latching is how your baby's mouth attaches to your nipple to breastfeed. It is an important part of breastfeeding. Your baby may have trouble latching for a number of reasons, such as:  Not being in the right  position.  Using a bottle or pacifier too early.  Problems within your baby's mouth, tongue, or lips.  The shape of your nipples.  Your baby being born early (prematurely). Small babies often have a weak suck.  Breasts becoming overfilled with milk (engorged breasts).  Express a little milk to help soften the breast. Work with a breastfeeding specialist (lactation consultant) to help your baby have a good latch. How does this affect me? A poor latch may cause you to have problems such as:  Cracked nipples.  Sore nipples.  Breasts becoming overfilled with milk  Plugged milk ducts.  Low milk supply.  Breast inflammation.  Breast infection. How does this affect my baby? A poor latch may cause your baby to not be able to feed well. As a result, he or she may have trouble gaining weight. Follow these instructions at home: How to position your baby  Find a comfortable place to sit or lie down. Your neck and back should be well supported.  If you are seated, place a pillow or rolled-up blanket under your baby. This will bring him or her to the level of your breast.  Make sure that your baby's belly is facing your belly.  Try different positions to find one that works best for you and your baby. How to help your baby latch  To start, you might find it helpful to gently rub your breast. Move your fingertips in a circle as you massage from your chest wall toward your nipple. This helps milk flow. Keep doing this during feeding if needed.  Position your breast. Hold your breast with four fingers underneath and your thumb above your nipple. Keep your fingers away from your nipple and your baby's mouth. Follow these steps to help your baby latch: 1. Rub your baby's lips gently with your finger or nipple. 2. When your baby's mouth is open wide enough, quickly bring your baby to your breast and place your whole nipple into your baby's mouth. Place as much of the colored area around  your nipple (areola)as possible into your baby's mouth. 3. Your baby's tongue should be between   his or her lower gum and your breast. 4. You should be able to see more areola above your baby's upper lip than below the lower lip. 5. When your baby starts sucking, you will feel a gentle pull on your nipple. You should not feel any pain. Be patient. It is common for a baby to suck for about 2-3 minutes to start the flow of breast milk. 6. Make sure that your baby's mouth is in the right position around your nipple. Your baby's lips should make a seal on your breast and be turned outward.   General instructions  Look for these signs that your baby has latched on to your nipple: ? The baby is quietly tugging or sucking without causing you pain. ? You hear the baby swallow after every 3 or 4 sucks. ? You see movement above and in front of the baby's ears while he or she is sucking.  Be aware of these signs that your baby has not latched on to your nipple: ? The baby makes sucking sounds or smacking sounds while feeding. ? You have nipple pain.  If your baby is not latched well, put your little finger between your baby's gums and your nipple. This will break the seal. Then try to help your baby latch again.  If you need help, get help from a breastfeeding specialist. Contact a doctor if:  You have cracking or soreness in your nipples that lasts longer than 1 week.  You have nipple pain.  Your breasts are filled with too much milk (engorgement), and this does not improve after 48-72 hours.  You have a plugged milk duct and a fever.  You follow the tips for a good latch but need more help.  You have a pus-like fluid coming from your breast.  Your baby is not gaining weight.  Your baby loses weight. Summary  Latching is how your baby's mouth attaches to your nipple to breastfeed.  Try different positions for breastfeeding to find one that works best for you and your baby.  A poor  latch may cause you to have cracked or sore nipples or other problems.  Work with a breastfeeding specialist (lactation consultant) to help your baby have a good latch. This information is not intended to replace advice given to you by your health care provider. Make sure you discuss any questions you have with your health care provider. Document Revised: 03/05/2020 Document Reviewed: 03/05/2020 Elsevier Patient Education  2021 [employer] Breastfeeding  Choosing to breastfeed is one of the best decisions you can make for yourself and your baby. A change in hormones during pregnancy causes your breasts to make breast milk in your milk-producing glands. Hormones prevent breast milk from being released before your baby is born. They also prompt milk flow after birth. Once breastfeeding has begun, thoughts of your baby, as well as his or her sucking or crying, can stimulate the release of milk from your milk-producing glands. Benefits of breastfeeding Research shows that breastfeeding offers many health benefits for infants and mothers. It also offers a cost-free and convenient way to feed your baby. For your baby  Your first milk (colostrum) helps your baby's digestive system to function better.  Special cells in your milk (antibodies) help your baby to fight off infections.  Breastfed babies are less likely to develop asthma, allergies, obesity, or type 2 diabetes. They are also at lower risk for sudden infant death syndrome (SIDS).  Nutrients in breast milk are better able   to meet your baby's needs compared to infant formula.  Breast milk improves your baby's brain development. For you  Breastfeeding helps to create a very special bond between you and your baby.  Breastfeeding is convenient. Breast milk costs nothing and is always available at the correct temperature.  Breastfeeding helps to burn calories. It helps you to lose the weight that you gained during  pregnancy.  Breastfeeding makes your uterus return faster to its size before pregnancy. It also slows bleeding (lochia) after you give birth.  Breastfeeding helps to lower your risk of developing type 2 diabetes, osteoporosis, rheumatoid arthritis, cardiovascular disease, and breast, ovarian, uterine, and endometrial cancer later in life. Breastfeeding basics Starting breastfeeding  Find a comfortable place to sit or lie down, with your neck and back well-supported.  Place a pillow or a rolled-up blanket under your baby to bring him or her to the level of your breast (if you are seated). Nursing pillows are specially designed to help support your arms and your baby while you breastfeed.  Make sure that your baby's tummy (abdomen) is facing your abdomen.  Gently massage your breast. With your fingertips, massage from the outer edges of your breast inward toward the nipple. This encourages milk flow. If your milk flows slowly, you may need to continue this action during the feeding.  Support your breast with 4 fingers underneath and your thumb above your nipple (make the letter "C" with your hand). Make sure your fingers are well away from your nipple and your baby's mouth.  Stroke your baby's lips gently with your finger or nipple.  When your baby's mouth is open wide enough, quickly bring your baby to your breast, placing your entire nipple and as much of the areola as possible into your baby's mouth. The areola is the colored area around your nipple. ? More areola should be visible above your baby's upper lip than below the lower lip. ? Your baby's lips should be opened and extended outward (flanged) to ensure an adequate, comfortable latch. ? Your baby's tongue should be between his or her lower gum and your breast.  Make sure that your baby's mouth is correctly positioned around your nipple (latched). Your baby's lips should create a seal on your breast and be turned out (everted).  It  is common for your baby to suck about 2-3 minutes in order to start the flow of breast milk. Latching Teaching your baby how to latch onto your breast properly is very important. An improper latch can cause nipple pain, decreased milk supply, and poor weight gain in your baby. Also, if your baby is not latched onto your nipple properly, he or she may swallow some air during feeding. This can make your baby fussy. Burping your baby when you switch breasts during the feeding can help to get rid of the air. However, teaching your baby to latch on properly is still the best way to prevent fussiness from swallowing air while breastfeeding. Signs that your baby has successfully latched onto your nipple  Silent tugging or silent sucking, without causing you pain. Infant's lips should be extended outward (flanged).  Swallowing heard between every 3-4 sucks once your milk has started to flow (after your let-down milk reflex occurs).  Muscle movement above and in front of his or her ears while sucking. Signs that your baby has not successfully latched onto your nipple  Sucking sounds or smacking sounds from your baby while breastfeeding.  Nipple pain. If you think   your baby has not latched on correctly, slip your finger into the corner of your baby's mouth to break the suction and place it between your baby's gums. Attempt to start breastfeeding again. Signs of successful breastfeeding Signs from your baby  Your baby will gradually decrease the number of sucks or will completely stop sucking.  Your baby will fall asleep.  Your baby's body will relax.  Your baby will retain a small amount of milk in his or her mouth.  Your baby will let go of your breast by himself or herself. Signs from you  Breasts that have increased in firmness, weight, and size 1-3 hours after feeding.  Breasts that are softer immediately after breastfeeding.  Increased milk volume, as well as a change in milk consistency  and color by the fifth day of breastfeeding.  Nipples that are not sore, cracked, or bleeding. Signs that your baby is getting enough milk  Wetting at least 1-2 diapers during the first 24 hours after birth.  Wetting at least 5-6 diapers every 24 hours for the first week after birth. The urine should be clear or pale yellow by the age of 5 days.  Wetting 6-8 diapers every 24 hours as your baby continues to grow and develop.  At least 3 stools in a 24-hour period by the age of 5 days. The stool should be soft and yellow.  At least 3 stools in a 24-hour period by the age of 7 days. The stool should be seedy and yellow.  No loss of weight greater than 10% of birth weight during the first 3 days of life.  Average weight gain of 4-7 oz (113-198 g) per week after the age of 4 days.  Consistent daily weight gain by the age of 5 days, without weight loss after the age of 2 weeks. After a feeding, your baby may spit up a small amount of milk. This is normal. Breastfeeding frequency and duration Frequent feeding will help you make more milk and can prevent sore nipples and extremely full breasts (breast engorgement). Breastfeed when you feel the need to reduce the fullness of your breasts or when your baby shows signs of hunger. This is called "breastfeeding on demand." Signs that your baby is hungry include:  Increased alertness, activity, or restlessness.  Movement of the head from side to side.  Opening of the mouth when the corner of the mouth or cheek is stroked (rooting).  Increased sucking sounds, smacking lips, cooing, sighing, or squeaking.  Hand-to-mouth movements and sucking on fingers or hands.  Fussing or crying. Avoid introducing a pacifier to your baby in the first 4-6 weeks after your baby is born. After this time, you may choose to use a pacifier. Research has shown that pacifier use during the first year of a baby's life decreases the risk of sudden infant death syndrome  (SIDS). Allow your baby to feed on each breast as long as he or she wants. When your baby unlatches or falls asleep while feeding from the first breast, offer the second breast. Because newborns are often sleepy in the first few weeks of life, you may need to awaken your baby to get him or her to feed. Breastfeeding times will vary from baby to baby. However, the following rules can serve as a guide to help you make sure that your baby is properly fed:  Newborns (babies 4 weeks of age or younger) may breastfeed every 1-3 hours.  Newborns should not go without breastfeeding   for longer than 3 hours during the day or 5 hours during the night.  You should breastfeed your baby a minimum of 8 times in a 24-hour period. Breast milk pumping Pumping and storing breast milk allows you to make sure that your baby is exclusively fed your breast milk, even at times when you are unable to breastfeed. This is especially important if you go back to work while you are still breastfeeding, or if you are not able to be present during feedings. Your lactation consultant can help you find a method of pumping that works best for you and give you guidelines about how long it is safe to store breast milk.      Caring for your breasts while you breastfeed Nipples can become dry, cracked, and sore while breastfeeding. The following recommendations can help keep your breasts moisturized and healthy:  Avoid using soap on your nipples.  Wear a supportive bra designed especially for nursing. Avoid wearing underwire-style bras or extremely tight bras (sports bras).  Air-dry your nipples for 3-4 minutes after each feeding.  Use only cotton bra pads to absorb leaked breast milk. Leaking of breast milk between feedings is normal.  Use lanolin on your nipples after breastfeeding. Lanolin helps to maintain your skin's normal moisture barrier. Pure lanolin is not harmful (not toxic) to your baby. You may also hand express a few  drops of breast milk and gently massage that milk into your nipples and allow the milk to air-dry. In the first few weeks after giving birth, some women experience breast engorgement. Engorgement can make your breasts feel heavy, warm, and tender to the touch. Engorgement peaks within 3-5 days after you give birth. The following recommendations can help to ease engorgement:  Completely empty your breasts while breastfeeding or pumping. You may want to start by applying warm, moist heat (in the shower or with warm, water-soaked hand towels) just before feeding or pumping. This increases circulation and helps the milk flow. If your baby does not completely empty your breasts while breastfeeding, pump any extra milk after he or she is finished.  Apply ice packs to your breasts immediately after breastfeeding or pumping, unless this is too uncomfortable for you. To do this: ? Put ice in a plastic bag. ? Place a towel between your skin and the bag. ? Leave the ice on for 20 minutes, 2-3 times a day.  Make sure that your baby is latched on and positioned properly while breastfeeding. If engorgement persists after 48 hours of following these recommendations, contact your health care provider or a lactation consultant. Overall health care recommendations while breastfeeding  Eat 3 healthy meals and 3 snacks every day. Well-nourished mothers who are breastfeeding need an additional 450-500 calories a day. You can meet this requirement by increasing the amount of a balanced diet that you eat.  Drink enough water to keep your urine pale yellow or clear.  Rest often, relax, and continue to take your prenatal vitamins to prevent fatigue, stress, and low vitamin and mineral levels in your body (nutrient deficiencies).  Do not use any products that contain nicotine or tobacco, such as cigarettes and e-cigarettes. Your baby may be harmed by chemicals from cigarettes that pass into breast milk and exposure to  secondhand smoke. If you need help quitting, ask your health care provider.  Avoid alcohol.  Do not use illegal drugs or marijuana.  Talk with your health care provider before taking any medicines. These include over-the-counter and prescription   medicines as well as vitamins and herbal supplements. Some medicines that may be harmful to your baby can pass through breast milk.  It is possible to become pregnant while breastfeeding. If birth control is desired, ask your health care provider about options that will be safe while breastfeeding your baby. Where to find more information: [employer]: www.llli.org Contact a health care provider if:  You feel like you want to stop breastfeeding or have become frustrated with breastfeeding.  Your nipples are cracked or bleeding.  Your breasts are red, tender, or warm.  You have: ? Painful breasts or nipples. ? A swollen area on either breast. ? A fever or chills. ? Nausea or vomiting. ? Drainage other than breast milk from your nipples.  Your breasts do not become full before feedings by the fifth day after you give birth.  You feel sad and depressed.  Your baby is: ? Too sleepy to eat well. ? Having trouble sleeping. ? More than 1 week old and wetting fewer than 6 diapers in a 24-hour period. ? Not gaining weight by 5 days of age.  Your baby has fewer than 3 stools in a 24-hour period.  Your baby's skin or the white parts of his or her eyes become yellow. Get help right away if:  Your baby is overly tired (lethargic) and does not want to wake up and feed.  Your baby develops an unexplained fever. Summary  Breastfeeding offers many health benefits for infant and mothers.  Try to breastfeed your infant when he or she shows early signs of hunger.  Gently tickle or stroke your baby's lips with your finger or nipple to allow the baby to open his or her mouth. Bring the baby to your breast. Make sure that much of  the areola is in your baby's mouth. Offer one side and burp the baby before you offer the other side.  Talk with your health care provider or lactation consultant if you have questions or you face problems as you breastfeed. This information is not intended to replace advice given to you by your health care provider. Make sure you discuss any questions you have with your health care provider. Document Revised: 12/01/2017 Document Reviewed: 10/08/2016 Elsevier Patient Education  2021 Elsevier Inc. Breast Pumping Tips Breast pumping is a way to get milk out of your breasts. You will then store the milk for your baby to use when you are away from home. There are three ways to pump.  You can use your hand to massage and squeeze your breast (hand expression).  You can use a hand-held machine to manually pump your milk.  You can use an electric machine to pump your milk. In the beginning you may not get much milk. After a few days, your breasts should make more. Pumping can help you start making milk after your baby is born. Pumping helps you to keep making milk when you are away from your baby. When should I pump? You can start pumping soon after your baby is born. Follow these tips:  When you are with your baby: ? Pump after you breastfeed. ? Pump from the free breast while you breastfeed.  When you are away from your baby: ? Pump every 2-3 hours for 15 minutes. ? Pump both breasts at the same time if you can.  If your baby drinks formula, pump around the time your baby gets the formula.  If you drank alcohol, wait 2 hours before you   pump.  If you are going to have surgery, ask your doctor when you should pump again. How do I get ready to pump? Try to relax. Try these things to help your milk come in:  Smell your baby's blanket or clothes.  Look at a picture or video of your baby.  Sit in a quiet, private space.  Place a cloth on your breast. The cloth should be warm and a little  wet.  Massage your breast and nipple.  Play relaxing music.  Picture your milk flowing.  Drink water and eat a snack. What are some tips? General tips for pumping breast milk  Always wash your hands with soap and water for at least 20 seconds before pumping.  If you do not get much milk or if pumping hurts, try different pump settings or a different kind of pump.  Drink enough fluid so your pee (urine) is clear or pale yellow.  Wear clothing that opens in the front or is easy to take off.  Pump milk into a clean bottle or container.  Do not smoke or use any products that contain nicotine or tobacco. If you need help quitting, ask your doctor.  Try to get a hands-free pumping bra, if possible. This makes it easy to pump breast milk. You can buy one or make your own.   Tips for storing breast milk  Store breast milk in a clean, BPA-free container. These include: ? A glass or plastic bottle. ? A milk storage bag.  Store only 2-4 ounces of breast milk in each container.  Swirl the breast milk in the container. Do not shake it.  Write down the date you pumped the milk on the container.  This is how long you can store breast milk: ? Room temperature: 6-8 hours. It is best to use the milk within 4 hours. ? Cooler with ice packs: 24 hours. ? Refrigerator: 5-8 days, if the milk is clean. It is best to use the milk within 3 days. ? Freezer: 9-12 months, if the milk is clean and stored away from the freezer door. It is best to use the milk within 6 months.  Put milk in the back of the refrigerator or freezer.  Thaw frozen milk using warm water. Do not use the microwave.   Tips for choosing a breast pump When choosing a pump, keep the following things in mind:  Manual breast pumps do not need electricity. They cost less. They can be hard to use.  Electric breast pumps use electricity. They are more expensive. They are easier to use. They collect more milk.  The suction cup  (flange) should be the right size.  Before you buy the pump, check if your insurance will pay for it. Tips for caring for a breast pump  Check the manual that came with your pump for cleaning tips.  Try not to touch the inside of pump parts.  Clean the pump after you use it. To do this: ? Wipe down the electrical part. Use a dry cloth or paper towel. Do not put this part in water or in cleaning products. ? Wash the plastic parts with soap and warm water. Or use the dishwasher if the manual says it is safe. You do not need to clean the tubing unless it touched breast milk. ? Let all the parts air dry. Avoid drying them with a cloth or towel. ? When the parts are clean and dry, put the pump back together. Then   store the pump.  If there is water in the tubing when you want to pump: 1. Attach the tubing to the pump. 2. Turn on the pump to dry the tubing. 3. Turn off the pump when the tube is dry. Summary  Pumping can help you start making milk after your baby is born. It lets you keep making milk when you are away from your baby.  When you are away from your baby, pump for about 15 minutes every 2-3 hours. Pump both breasts at the same time, if you can. This information is not intended to replace advice given to you by your health care provider. Make sure you discuss any questions you have with your health care provider. Document Revised: 06/17/2020 Document Reviewed: 06/17/2020 Elsevier Patient Education  2021 Elsevier Inc.  

## 2021-01-01 NOTE — Progress Notes (Signed)
Pt discharged.  Discharge instructions, prescriptions and follow up appointment given to and reviewed with pt. Pt verbalized understanding.  Infant will be transferred to pediatrics.

## 2021-01-01 NOTE — Lactation Note (Signed)
This note was copied from a baby's chart. Lactation Consultation Note  Patient Name: Isabel Brewer [id number] Date: 01/01/2021 Reason for consult: Follow-up assessment;1st time breastfeeding;Late-preterm 34-36.6wks;Infant < 6lbs Age:31 hours   Lactation follow-up. Baby increased intake volumes, maintained temperature overnight, however bilirubin is elevated.  Family desires discharge today, pending car seat test and circumcision.  Mom is pumping every 3 hours, volumes up to 60mL per session, baby consuming total of 17-77mL with remaining volume being 22 cal formula.  Mom has medicaid, will follow-up about pump obtainment, however LC encouraged enrollment in [facility] services for pump post discharge. LC and mom discussed importance of pumping routine and consistency in building supply, and continuation of working to get baby active at the breast. Educated mom on development of baby and alertness increasing as baby becomes closer to [redacted]wk gestation.  LC reviewed anticipated breast changes over the days to come, importance of complete emptying, breast and nipple care, engorgement and fullness management.   Information for outpatient lactation services and community breastfeeding support.  Family declines WIC referral at this time.  Maternal Data Has patient been taught Hand Expression?: Yes Does the patient have breastfeeding experience prior to this delivery?: No  Feeding Mother's Current Feeding Choice: Breast Milk  LATCH Score                    Lactation Tools Discussed/Used Tools: Pump;Nipple Shields Nipple shield size: 20 Breast pump type: Double-Electric Breast Pump Pump Education: Setup, frequency, and cleaning;Milk Storage Reason for Pumping: 35wk Pumping frequency: q 3h Pumped volume: 15 mL  Interventions Interventions: Breast feeding basics reviewed;Hand express;Education;DEBP  Discharge    Consult Status Consult Status: Follow-up Date:  01/01/21 Follow-up type: Call as needed    Danford Bad 01/01/2021, 10:19 AM

## 2021-01-02 ENCOUNTER — Ambulatory Visit: Payer: Self-pay

## 2021-01-02 NOTE — Lactation Note (Signed)
This note was copied from a baby's chart. Lactation Consultation Note  Patient Name: Boy [place] Dowd [id number] Date: 01/02/2021 Reason for consult: Follow-up assessment;Primapara;Late-preterm 34-36.6wks;Hyperbilirubinemia Age:31 hours  Maternal Data    Feeding Mother's Current Feeding Choice: Breast Milk Nipple Type: Slow - flow  LATCH Score                    Lactation Tools Discussed/Used Tools: Bottle Breast pump type: Double-Electric Breast Pump Reason for Pumping: provide EBM Pumping frequency: q2-3 hrs Pumped volume: 100 mL  Interventions Interventions: DEBP  Discharge Pump: DEBP;Rented (rented [facility] #[id number] for use after d/c x 1 wk) [facility] Program: No  Consult Status Consult Status: PRN Date: 01/02/21 Follow-up type: In-patient    Dyann Kief 01/02/2021, 4:45 PM

## 2021-01-25 ENCOUNTER — Other Ambulatory Visit: Payer: Self-pay | Admitting: Obstetrics and Gynecology

## 2021-01-25 NOTE — Progress Notes (Signed)
   History of Present Illness:  Chief Complaint: scheduled IOL for chronic HTN  HPI:  Isabel Brewer is a 30 y.o. G1P0000 female at [redacted]w[redacted]d dated by [place] at [redacted]w[redacted]d, inconsistent with LMP 04/09/20.  She presents to L&D for scheduled IOL due to [facility].    Pregnancy Issues: 1. Obesity - BMI 39 2. Elevated early 1hr GTT  1hr GTT 147  3hr GTT : 99, 143, 130, 108  Repeat 3hr GTT at 28 weeks: 102-165-133-61 3. Chronic HTN- dx early in preg  Started Procardia XL 30 MG 08/25/2020,low dose ASA 81 mg    Maternal Medical History:   Past Medical History:  Diagnosis Date  . Abnormal Pap smear of cervix 12/2017  . Chronic hypertension   . History of colposcopy 2019    Past Surgical History:  Procedure Laterality Date  . ADENOIDECTOMY      No Known Allergies  Prior to Admission medications   Medication Sig Start Date End Date Taking? Authorizing Provider  benzocaine-Menthol (DERMOPLAST) 20-0.5 % AERO Apply 1 application topically as needed for irritation (perineal discomfort). 01/01/21   Loyda Costin, Prudencio Pair, CNM  coconut oil OIL Apply 1 application topically as needed. 01/01/21   Summer Mccolgan, Prudencio Pair, CNM  docusate sodium (COLACE) 100 MG capsule Take 1 capsule (100 mg total) by mouth 2 (two) times daily. 01/01/21   Bettina Warn, Prudencio Pair, CNM  ferrous sulfate 325 (65 FE) MG tablet Take 1 tablet (325 mg total) by mouth 2 (two) times daily with a meal. 01/01/21   Ramiz Turpin, Prudencio Pair, CNM  ibuprofen (ADVIL) 600 MG tablet Take 1 tablet (600 mg total) by mouth every 6 (six) hours. 01/01/21   Rhiley Tarver, Prudencio Pair, CNM  NIFEdipine (ADALAT CC) 30 MG 24 hr tablet Take 30 mg by mouth daily.    [provider]  Prenatal Vit-Fe Fumarate-FA (MULTIVITAMIN-PRENATAL) 27-0.8 MG TABS tablet Take 1 tablet by mouth daily at 12 noon.    [provider]  simethicone (MYLICON) 80 MG chewable tablet Chew 1 tablet (80 mg total) by mouth as needed for flatulence. 01/01/21   Dajon Rowe, Prudencio Pair, CNM  witch hazel-glycerin  (TUCKS) pad Apply 1 application topically as needed for hemorrhoids. 01/01/21   Shekinah Pitones, Prudencio Pair, CNM     Prenatal care site: [facility]   Social History: She  reports that she has never smoked. She has never used smokeless tobacco. She reports previous alcohol use. She reports that she does not use drugs.  Family History: family history includes Alcohol abuse in her father; Colon cancer in her maternal grandmother; Diabetes Mellitus II in her mother; Hypertension in her mother; Obesity in her mother; Uterine cancer in her mother.    Pertinent Results:  Prenatal Labs: Blood type/Rh  O Pos  Antibody screen neg  Rubella Immune  Varicella Immune  RPR NR  HBsAg Neg  HIV NR  GC neg  Chlamydia neg  Genetic screening Negative materniT21  1 hour GTT  147  3 hour GTT 102-165-133-61  GBS  not done   Plan:  1. Admit to Labor & Delivery; consents reviewed and obtained - COVID swab on admit.  - GBS by PCR on admit.   Post Partum Planning: - Infant feeding: breast - Contraception: considering spouse vasectomy - Tdap: 12/03/20 - Flu: 08/25/20  Prudencio Pair Aryn Safran, CNM 01/25/21 10:26 PM

## 2021-01-26 NOTE — Progress Notes (Signed)
Toney Sang RN called pt at 0030, voicemail to phone not set up, pt not currently in hospital for 0000 induction of labor appointment. No other contact info found. Will attempt to contact pt again via provided phone number.

## 2021-03-04 ENCOUNTER — Inpatient Hospital Stay (HOSPITAL_COMMUNITY)
Admission: AD | Admit: 2021-03-04 | Discharge: 2021-03-07 | DRG: 419 | Disposition: A | Discharge status: Home / Self Care | Payer: Medicaid Other | Source: Other Acute Inpatient Hospital | Attending: Internal Medicine | Admitting: Internal Medicine

## 2021-03-04 ENCOUNTER — Encounter: Payer: Self-pay | Admitting: Intensive Care

## 2021-03-04 ENCOUNTER — Other Ambulatory Visit: Payer: Self-pay

## 2021-03-04 ENCOUNTER — Emergency Department
Admission: EM | Admit: 2021-03-04 | Discharge: 2021-03-04 | Disposition: A | Discharge status: Other Acute Inpatient Hospital | Payer: Medicaid Other | Attending: Emergency Medicine | Admitting: Emergency Medicine

## 2021-03-04 ENCOUNTER — Emergency Department: Payer: Medicaid Other

## 2021-03-04 DIAGNOSIS — K8060 Calculus of gallbladder and bile duct with cholecystitis, unspecified, without obstruction: Principal | ICD-10-CM | POA: Diagnosis present

## 2021-03-04 DIAGNOSIS — I10 Essential (primary) hypertension: Secondary | ICD-10-CM | POA: Insufficient documentation

## 2021-03-04 DIAGNOSIS — Z8249 Family history of ischemic heart disease and other diseases of the circulatory system: Secondary | ICD-10-CM

## 2021-03-04 DIAGNOSIS — R1011 Right upper quadrant pain: Secondary | ICD-10-CM | POA: Diagnosis present

## 2021-03-04 DIAGNOSIS — K219 Gastro-esophageal reflux disease without esophagitis: Secondary | ICD-10-CM | POA: Diagnosis not present

## 2021-03-04 DIAGNOSIS — K8050 Calculus of bile duct without cholangitis or cholecystitis without obstruction: Secondary | ICD-10-CM | POA: Diagnosis present

## 2021-03-04 DIAGNOSIS — E860 Dehydration: Secondary | ICD-10-CM | POA: Diagnosis present

## 2021-03-04 DIAGNOSIS — Z79899 Other long term (current) drug therapy: Secondary | ICD-10-CM

## 2021-03-04 DIAGNOSIS — R7401 Elevation of levels of liver transaminase levels: Secondary | ICD-10-CM | POA: Diagnosis not present

## 2021-03-04 DIAGNOSIS — Z20822 Contact with and (suspected) exposure to covid-19: Secondary | ICD-10-CM | POA: Diagnosis not present

## 2021-03-04 DIAGNOSIS — R109 Unspecified abdominal pain: Secondary | ICD-10-CM

## 2021-03-04 DIAGNOSIS — Z419 Encounter for procedure for purposes other than remedying health state, unspecified: Secondary | ICD-10-CM

## 2021-03-04 DIAGNOSIS — R7989 Other specified abnormal findings of blood chemistry: Secondary | ICD-10-CM

## 2021-03-04 LAB — COMPREHENSIVE METABOLIC PANEL
ALT: 198 U/L — ABNORMAL HIGH (ref 0–44)
AST: 436 U/L — ABNORMAL HIGH (ref 15–41)
Albumin: 4.3 g/dL (ref 3.5–5.0)
Alkaline Phosphatase: 98 U/L (ref 38–126)
Anion gap: 11 (ref 5–15)
BUN: 12 mg/dL (ref 6–20)
CO2: 26 mmol/L (ref 22–32)
Calcium: 9.6 mg/dL (ref 8.9–10.3)
Chloride: 102 mmol/L (ref 98–111)
Creatinine, Ser: 0.85 mg/dL (ref 0.44–1.00)
GFR, Estimated: >60 mL/min (ref >60)
Glucose, Bld: 107 mg/dL — ABNORMAL HIGH (ref 70–99)
Potassium: 4.3 mmol/L (ref 3.5–5.1)
Sodium: 139 mmol/L (ref 135–145)
Total Bilirubin: 3.7 mg/dL — ABNORMAL HIGH (ref 0.3–1.2)
Total Protein: 8.2 g/dL — ABNORMAL HIGH (ref 6.5–8.1)

## 2021-03-04 LAB — CBC
HCT: 36.9 % (ref 36.0–46.0)
Hemoglobin: 12.2 g/dL (ref 12.0–15.0)
MCH: 27.2 pg (ref 26.0–34.0)
MCHC: 33.1 g/dL (ref 30.0–36.0)
MCV: 82.4 fL (ref 80.0–100.0)
Platelets: 280 10*3/uL (ref 150–400)
RBC: 4.48 MIL/uL (ref 3.87–5.11)
RDW: 13.7 % (ref 11.5–15.5)
WBC: 9.7 10*3/uL (ref 4.0–10.5)
nRBC: 0 % (ref 0.0–0.2)

## 2021-03-04 LAB — URINALYSIS, COMPLETE (UACMP) WITH MICROSCOPIC
Bacteria, UA: NONE SEEN
Glucose, UA: NEGATIVE mg/dL
Hgb urine dipstick: NEGATIVE
Ketones, ur: NEGATIVE mg/dL
Leukocytes,Ua: NEGATIVE
Nitrite: NEGATIVE
Protein, ur: 30 mg/dL — AB
Specific Gravity, Urine: 1.032 — ABNORMAL HIGH (ref 1.005–1.030)
pH: 7 (ref 5.0–8.0)

## 2021-03-04 LAB — RESP PANEL BY RT-PCR (FLU A&B, COVID) ARPGX2
Influenza A by PCR: NEGATIVE
Influenza B by PCR: NEGATIVE
SARS Coronavirus 2 by RT PCR: NEGATIVE

## 2021-03-04 LAB — POC URINE PREG, ED: Preg Test, Ur: NEGATIVE

## 2021-03-04 LAB — LIPASE, BLOOD: Lipase: 28 U/L (ref 11–51)

## 2021-03-04 MED ORDER — SODIUM CHLORIDE 0.9 % IV BOLUS
1000.0000 mL | Freq: Once | INTRAVENOUS | Status: AC
  Administered 2021-03-04: 1000 mL via INTRAVENOUS

## 2021-03-04 MED ORDER — FENTANYL CITRATE (PF) 100 MCG/2ML IJ SOLN
50.0000 ug | Freq: Once | INTRAMUSCULAR | Status: AC
Start: 2021-03-04 — End: 2021-03-04
  Administered 2021-03-04: 50 ug via INTRAVENOUS
  Filled 2021-03-04: qty 2

## 2021-03-04 MED ORDER — FENTANYL CITRATE (PF) 100 MCG/2ML IJ SOLN: 50.0000 ug | Freq: Once | INTRAMUSCULAR | Status: DC

## 2021-03-04 NOTE — ED Provider Notes (Signed)
[facility] Emergency Department Provider Note   ____________________________________________   I have reviewed the triage vital signs and the nursing notes.   HISTORY  Chief Complaint Abdominal Pain   History limited by: Not Limited   HPI Isabel Brewer is a 30 y.o. female who presents to the emergency department today because of concern for abdominal pain. Located in the right upper quadrant. Started last night shortly after eating and had been constant until just a few hours ago when it started to get better. The patient states that she is not having any pain at the time of my exam. She did have an episode of vomiting this morning. The patient denies any shortness of breath or chest pain. Did have similar pain occasionally during a recent pregnancy, delivered 2 months ago. Did think it might be related to heart burn. Mother did have gallbladder removed.    Records reviewed. Per medical record review patient has a history of HTN.  Past Medical History:  Diagnosis Date   Abnormal Pap smear of cervix 12/2017   Chronic hypertension    History of colposcopy 2019    Patient Active Problem List   Diagnosis Date Noted   Chronic hypertension affecting pregnancy 12/30/2020   Chronic hypertension 12/30/2020   NSVD (normal spontaneous vaginal delivery) 12/30/2020   PCOS (polycystic ovarian syndrome) 04/26/2018    Past Surgical History:  Procedure Laterality Date   ADENOIDECTOMY      Prior to Admission medications   Medication Sig Start Date End Date Taking? Authorizing Provider  benzocaine-Menthol (DERMOPLAST) 20-0.5 % AERO Apply 1 application topically as needed for irritation (perineal discomfort). 01/01/21   McVey, Prudencio Pair, CNM  coconut oil OIL Apply 1 application topically as needed. 01/01/21   McVey, Prudencio Pair, CNM  docusate sodium (COLACE) 100 MG capsule Take 1 capsule (100 mg total) by mouth 2 (two) times daily. 01/01/21   McVey, Prudencio Pair, CNM   ferrous sulfate 325 (65 FE) MG tablet Take 1 tablet (325 mg total) by mouth 2 (two) times daily with a meal. 01/01/21   McVey, Prudencio Pair, CNM  ibuprofen (ADVIL) 600 MG tablet Take 1 tablet (600 mg total) by mouth every 6 (six) hours. 01/01/21   McVey, Prudencio Pair, CNM  NIFEdipine (ADALAT CC) 30 MG 24 hr tablet Take 30 mg by mouth daily.    [provider]  Prenatal Vit-Fe Fumarate-FA (MULTIVITAMIN-PRENATAL) 27-0.8 MG TABS tablet Take 1 tablet by mouth daily at 12 noon.    [provider]  simethicone (MYLICON) 80 MG chewable tablet Chew 1 tablet (80 mg total) by mouth as needed for flatulence. 01/01/21   McVey, Prudencio Pair, CNM  witch hazel-glycerin (TUCKS) pad Apply 1 application topically as needed for hemorrhoids. 01/01/21   McVey, Prudencio Pair, CNM    Allergies Patient has no known allergies.  Family History  Problem Relation Age of Onset   Alcohol abuse Father    Colon cancer Maternal Grandmother    Diabetes Mellitus II Mother    Hypertension Mother    Obesity Mother    Uterine cancer Mother     Social History Social History   Tobacco Use   Smoking status: Never   Smokeless tobacco: Never  Substance Use Topics   Alcohol use: Not Currently   Drug use: Never    Review of Systems Constitutional: No fever/chills Eyes: No visual changes. ENT: No sore throat. Cardiovascular: Denies chest pain. Respiratory: Denies shortness of breath. Gastrointestinal: Positive for RUQ pain. Positive  for episode of vomiting.  Genitourinary: Negative for dysuria. Musculoskeletal: Negative for back pain. Skin: Negative for rash. Neurological: Negative for headaches, focal weakness or numbness.  ____________________________________________   PHYSICAL EXAM:  VITAL SIGNS: ED Triage Vitals  Enc Vitals Group     BP 03/04/21 1431 132/85     Pulse Rate 03/04/21 1431 85     Resp 03/04/21 1704 18     Temp 03/04/21 1431 98 F (36.7 C)     Temp Source 03/04/21 1431 Oral     SpO2  03/04/21 1431 98 %     Weight 03/04/21 1431 261 lb (118.4 kg)     Height 03/04/21 1431 5\' 10"  (1.778 m)     Head Circumference --      Peak Flow --      Pain Score 03/04/21 1431 7    Constitutional: Alert and oriented.  Eyes: Conjunctivae are normal.  ENT      Head: Normocephalic and atraumatic.      Nose: No congestion/rhinnorhea.      Mouth/Throat: Mucous membranes are moist.      Neck: No stridor. Hematological/Lymphatic/Immunilogical: No cervical lymphadenopathy. Cardiovascular: Normal rate, regular rhythm.  No murmurs, rubs, or gallops.  Respiratory: Normal respiratory effort without tachypnea nor retractions. Breath sounds are clear and equal bilaterally. No wheezes/rales/rhonchi. Gastrointestinal: Soft and tender to palpation in the right upper quadrant.  Genitourinary: Deferred Musculoskeletal: Normal range of motion in all extremities. No lower extremity edema. Neurologic:  Normal speech and language. No gross focal neurologic deficits are appreciated.  Skin:  Skin is warm, dry and intact. No rash noted. Psychiatric: Mood and affect are normal. Speech and behavior are normal. Patient exhibits appropriate insight and judgment.  ____________________________________________    LABS (pertinent positives/negatives)  Lipase 28 CBC wbc 9.7, hgb 12.2, plt 280 CMP na 139, k 4.3, glu 107, cr 0.85, t pro 8.2, ast 436, alt 198, t bili 3.7  ____________________________________________   EKG  I, 03/06/21, attending physician, personally viewed and interpreted this EKG  EKG Time: 1439 Rate: 63 Rhythm: normal sinus rhythm Axis: normal Intervals: qtc 413 QRS: narrow ST changes: no st elevation Impression: normal ekg  ____________________________________________    RADIOLOGY  RUQ Phineas Semen Cholelithiasis. Common bile duct stone  ____________________________________________   PROCEDURES  Procedures  ____________________________________________   INITIAL  IMPRESSION / ASSESSMENT AND PLAN / ED COURSE  Pertinent labs & imaging results that were available during my care of the patient were reviewed by me and considered in my medical decision making (see chart for details).   Patient presented to the emergency department today because of concern for RUQ pain. The patient had elevation of LFTs. RUQ [place] was performed which was concerning for common bile duct stone. Unfortunately no ERCP availability at [facility] over the next couple of days per GI. Discussed need for ERCP and transfer with patient. [facility] did accept. Discussed with Dr. [facility] with GI at [facility]. ____________________________________________   FINAL CLINICAL IMPRESSION(S) / ED DIAGNOSES  Final diagnoses:  Right sided abdominal pain  Common bile duct stone  Elevated liver function tests     Note: This dictation was prepared with Dragon dictation. Any transcriptional errors that result from this process are unintentional     [facility], MD 03/04/21 2000

## 2021-03-04 NOTE — ED Notes (Signed)
[facility]  CALLED  PER  DR  Derrill Kay  MD

## 2021-03-04 NOTE — ED Notes (Signed)
Pt verbalized consent to be transferred to another hospital.

## 2021-03-04 NOTE — ED Triage Notes (Signed)
Patient c/o right upper abdominal pain with N/V. Worsening pain last night. Reports pain all throughout pregnancy in RUQ. Gave birth two months ago

## 2021-03-05 ENCOUNTER — Encounter (HOSPITAL_COMMUNITY): Payer: Self-pay | Admitting: Family Medicine

## 2021-03-05 ENCOUNTER — Observation Stay (HOSPITAL_COMMUNITY): Payer: Medicaid Other

## 2021-03-05 DIAGNOSIS — K8020 Calculus of gallbladder without cholecystitis without obstruction: Secondary | ICD-10-CM

## 2021-03-05 DIAGNOSIS — Z79899 Other long term (current) drug therapy: Secondary | ICD-10-CM | POA: Diagnosis not present

## 2021-03-05 DIAGNOSIS — R9389 Abnormal findings on diagnostic imaging of other specified body structures: Secondary | ICD-10-CM | POA: Diagnosis not present

## 2021-03-05 DIAGNOSIS — R945 Abnormal results of liver function studies: Secondary | ICD-10-CM | POA: Diagnosis not present

## 2021-03-05 DIAGNOSIS — R1011 Right upper quadrant pain: Secondary | ICD-10-CM

## 2021-03-05 DIAGNOSIS — K8060 Calculus of gallbladder and bile duct with cholecystitis, unspecified, without obstruction: Secondary | ICD-10-CM | POA: Diagnosis present

## 2021-03-05 DIAGNOSIS — K8050 Calculus of bile duct without cholangitis or cholecystitis without obstruction: Secondary | ICD-10-CM | POA: Diagnosis present

## 2021-03-05 DIAGNOSIS — Z8249 Family history of ischemic heart disease and other diseases of the circulatory system: Secondary | ICD-10-CM | POA: Diagnosis not present

## 2021-03-05 DIAGNOSIS — K219 Gastro-esophageal reflux disease without esophagitis: Secondary | ICD-10-CM | POA: Diagnosis present

## 2021-03-05 DIAGNOSIS — E860 Dehydration: Secondary | ICD-10-CM | POA: Diagnosis present

## 2021-03-05 DIAGNOSIS — I10 Essential (primary) hypertension: Secondary | ICD-10-CM | POA: Diagnosis present

## 2021-03-05 LAB — CBC WITH DIFFERENTIAL/PLATELET
Abs Immature Granulocytes: 0.02 10*3/uL (ref 0.00–0.07)
Basophils Absolute: 0 10*3/uL (ref 0.0–0.1)
Basophils Relative: 1 %
Eosinophils Absolute: 0.2 10*3/uL (ref 0.0–0.5)
Eosinophils Relative: 4 %
HCT: 34 % — ABNORMAL LOW (ref 36.0–46.0)
Hemoglobin: 11 g/dL — ABNORMAL LOW (ref 12.0–15.0)
Immature Granulocytes: 0 %
Lymphocytes Relative: 25 %
Lymphs Abs: 1.2 10*3/uL (ref 0.7–4.0)
MCH: 27.2 pg (ref 26.0–34.0)
MCHC: 32.4 g/dL (ref 30.0–36.0)
MCV: 84 fL (ref 80.0–100.0)
Monocytes Absolute: 0.5 10*3/uL (ref 0.1–1.0)
Monocytes Relative: 10 %
Neutro Abs: 2.8 10*3/uL (ref 1.7–7.7)
Neutrophils Relative %: 60 %
Platelets: 220 10*3/uL (ref 150–400)
RBC: 4.05 MIL/uL (ref 3.87–5.11)
RDW: 13.7 % (ref 11.5–15.5)
WBC: 4.7 10*3/uL (ref 4.0–10.5)
nRBC: 0 % (ref 0.0–0.2)

## 2021-03-05 LAB — COMPREHENSIVE METABOLIC PANEL
ALT: 263 U/L — ABNORMAL HIGH (ref 0–44)
AST: 433 U/L — ABNORMAL HIGH (ref 15–41)
Albumin: 3.1 g/dL — ABNORMAL LOW (ref 3.5–5.0)
Alkaline Phosphatase: 111 U/L (ref 38–126)
Anion gap: 6 (ref 5–15)
BUN: 9 mg/dL (ref 6–20)
CO2: 26 mmol/L (ref 22–32)
Calcium: 8.9 mg/dL (ref 8.9–10.3)
Chloride: 107 mmol/L (ref 98–111)
Creatinine, Ser: 0.77 mg/dL (ref 0.44–1.00)
GFR, Estimated: >60 mL/min (ref >60)
Glucose, Bld: 104 mg/dL — ABNORMAL HIGH (ref 70–99)
Potassium: 3.5 mmol/L (ref 3.5–5.1)
Sodium: 139 mmol/L (ref 135–145)
Total Bilirubin: 3.6 mg/dL — ABNORMAL HIGH (ref 0.3–1.2)
Total Protein: 6.4 g/dL — ABNORMAL LOW (ref 6.5–8.1)

## 2021-03-05 LAB — HIV ANTIBODY (ROUTINE TESTING W REFLEX): HIV Screen 4th Generation wRfx: NONREACTIVE

## 2021-03-05 LAB — PROTIME-INR
INR: 1.1 (ref 0.8–1.2)
Prothrombin Time: 14.2 seconds (ref 11.4–15.2)

## 2021-03-05 LAB — APTT: aPTT: 29 seconds (ref 24–36)

## 2021-03-05 LAB — SURGICAL PCR SCREEN
MRSA, PCR: NEGATIVE
Staphylococcus aureus: NEGATIVE

## 2021-03-05 LAB — MAGNESIUM: Magnesium: 2 mg/dL (ref 1.7–2.4)

## 2021-03-05 MED ORDER — MORPHINE SULFATE (PF) 2 MG/ML IV SOLN
2.0000 mg | INTRAVENOUS | Status: DC | PRN
  Administered 2021-03-06 (×2): 2 mg via INTRAVENOUS
  Filled 2021-03-05 (×2): qty 1

## 2021-03-05 MED ORDER — LACTATED RINGERS IV SOLN: INTRAVENOUS | Status: AC

## 2021-03-05 MED ORDER — ACETAMINOPHEN 650 MG RE SUPP: 650.0000 mg | Freq: Four times a day (QID) | RECTAL | Status: DC | PRN

## 2021-03-05 MED ORDER — GADOBUTROL 1 MMOL/ML IV SOLN
10.0000 mL | Freq: Once | INTRAVENOUS | Status: AC | PRN
  Administered 2021-03-05: 10 mL via INTRAVENOUS

## 2021-03-05 MED ORDER — ONDANSETRON HCL 4 MG/2ML IJ SOLN: 4.0000 mg | Freq: Four times a day (QID) | INTRAMUSCULAR | Status: DC | PRN

## 2021-03-05 MED ORDER — PANTOPRAZOLE SODIUM 40 MG IV SOLR
40.0000 mg | Freq: Every day | INTRAVENOUS | Status: DC
  Administered 2021-03-06 – 2021-03-07 (×2): 40 mg via INTRAVENOUS
  Filled 2021-03-05 (×2): qty 40

## 2021-03-05 MED ORDER — POLYETHYLENE GLYCOL 3350 17 G PO PACK
17.0000 g | PACK | Freq: Every day | ORAL | Status: DC | PRN
  Administered 2021-03-06: 17 g via ORAL
  Filled 2021-03-05: qty 1

## 2021-03-05 MED ORDER — ONDANSETRON HCL 4 MG PO TABS: 4.0000 mg | ORAL_TABLET | Freq: Four times a day (QID) | ORAL | Status: DC | PRN

## 2021-03-05 MED ORDER — OXYCODONE-ACETAMINOPHEN 5-325 MG PO TABS
1.0000 | ORAL_TABLET | ORAL | Status: DC | PRN
  Administered 2021-03-06 – 2021-03-07 (×2): 1 via ORAL
  Filled 2021-03-05 (×2): qty 1

## 2021-03-05 MED ORDER — CEFAZOLIN SODIUM-DEXTROSE 2-4 GM/100ML-% IV SOLN
2.0000 g | Freq: Once | INTRAVENOUS | Status: AC
  Administered 2021-03-06: 2 g via INTRAVENOUS

## 2021-03-05 MED ORDER — ACETAMINOPHEN 325 MG PO TABS
650.0000 mg | ORAL_TABLET | Freq: Four times a day (QID) | ORAL | Status: DC | PRN
  Administered 2021-03-05: 650 mg via ORAL
  Filled 2021-03-05: qty 2

## 2021-03-05 MED ORDER — LACTATED RINGERS IV SOLN: INTRAVENOUS | Status: DC

## 2021-03-05 NOTE — H&P (Signed)
History and Physical    Isabel Brewer KYH:[id number] DOB: 1991/07/10 DOA: 03/04/2021  PCP: Patient, No Pcp Per (Inactive)  Patient coming from: [facility] ED   Chief Complaint: Abdominal Pain    HPI:    30 year old female with past medical history of gastroesophageal reflux disease, PCOS who presents to [facility] as an incoming transfer from [facility] emergency department for abdominal pain nausea and vomiting.  Patient explains that for the past several months she has been experiencing episodes of epigastric and right upper quadrant pain.  Patient describes these episodes of pain as "tight" in quality, severe in intensity and radiating into the chest and underneath her shoulder blades.  Episodes of pain typically occur after eating a meal and sometime wake her from sleep if she eats a late dinner.  Patient recently had a child in April with progressively worsening symptoms despite this.  Several weeks ago, patient started an OTC regimen of Nexium with some improvement in symptoms.  The evening of 6/14, patient developed the most severe episode yet with 10 out of 10 abdominal pain.  Pain was intractable and ongoing Ortho following 24 hours.  Pain is associated with nausea, 1 episode of nonbilious nonbloody vomiting.  Patient has been unable to tolerate oral intake as a result.  Due to patient's progressively worsening symptoms patient eventually presented to [facility] emergency department for evaluation.    Upon evaluation in the emergency department patient was found to have clinical evidence of dehydration, elevated transaminases with AST of 436 and ALT of 198 and findings suggestive of a common bile duct obstruction on upper quadrant ultrasound.  Case was discussed with Dr. Adela Lank with GI and since ERCP is currently not available at [facility] it was recommended the patient be transferred to [facility] for further management and GI consultation.  Hospitalist group has been contacted to admit  the patient to [facility] on arrival.  Review of Systems:   Review of Systems  Gastrointestinal:  Positive for abdominal pain, nausea and vomiting.  All other systems reviewed and are negative.  Past Medical History:  Diagnosis Date   Abnormal Pap smear of cervix 12/2017   Chronic hypertension    History of colposcopy 2019    Past Surgical History:  Procedure Laterality Date   ADENOIDECTOMY       reports that she has never smoked. She has never used smokeless tobacco. She reports previous alcohol use. She reports that she does not use drugs.  No Known Allergies  Family History  Problem Relation Age of Onset   Alcohol abuse Father    Colon cancer Maternal Grandmother    Diabetes Mellitus II Mother    Hypertension Mother    Obesity Mother    Uterine cancer Mother      Prior to Admission medications   Medication Sig Start Date End Date Taking? Authorizing Provider  esomeprazole (NEXIUM) 20 MG capsule Take 20 mg by mouth daily at 12 noon.   Yes [provider]  ibuprofen (ADVIL) 600 MG tablet Take 1 tablet (600 mg total) by mouth every 6 (six) hours. Patient taking differently: Take 600 mg by mouth every 6 (six) hours as needed for mild pain. 01/01/21  Yes McVey, Prudencio Pair, CNM  Prenatal Vit-Fe Fumarate-FA (MULTIVITAMIN-PRENATAL) 27-0.8 MG TABS tablet Take 1 tablet by mouth daily at 12 noon.   Yes [provider]  benzocaine-Menthol (DERMOPLAST) 20-0.5 % AERO Apply 1 application topically as needed for irritation (perineal discomfort). 01/01/21   McVey, Lurena Joiner  A, CNM  coconut oil OIL Apply 1 application topically as needed. 01/01/21   McVey, Prudencio Pair, CNM  docusate sodium (COLACE) 100 MG capsule Take 1 capsule (100 mg total) by mouth 2 (two) times daily. 01/01/21   McVey, Prudencio Pair, CNM  ferrous sulfate 325 (65 FE) MG tablet Take 1 tablet (325 mg total) by mouth 2 (two) times daily with a meal. 01/01/21   McVey, Prudencio Pair, CNM  NIFEdipine (ADALAT CC) 30 MG 24  hr tablet Take 30 mg by mouth daily.    [provider]  simethicone (MYLICON) 80 MG chewable tablet Chew 1 tablet (80 mg total) by mouth as needed for flatulence. 01/01/21   McVey, Prudencio Pair, CNM  witch hazel-glycerin (TUCKS) pad Apply 1 application topically as needed for hemorrhoids. 01/01/21   McVey, Prudencio Pair, CNM    Physical Exam: Vitals:   03/04/21 2300  Height: 5\' 10"  (1.778 m)    Constitutional: Awake alert and oriented x3, no associated distress.   Skin: no rashes, no lesions, notable poor skin turgor Eyes: Pupils are equally reactive to light.  No evidence of scleral icterus or conjunctival pallor.  ENMT: Dry mucous membranes noted.  Posterior pharynx clear of any exudate or lesions.   Neck: normal, supple, no masses, no thyromegaly.  No evidence of jugular venous distension.   Respiratory: clear to auscultation bilaterally, no wheezing, no crackles. Normal respiratory effort. No accessory muscle use.  Cardiovascular: Regular rate and rhythm, no murmurs / rubs / gallops. No extremity edema. 2+ pedal pulses. No carotid bruits.  Chest:   Nontender without crepitus or deformity.   Back:   Nontender without crepitus or deformity. Abdomen: Diffuse abdominal tenderness, worst in the upper quadrants with some notable lower abdominal fullness on palpation..   Musculoskeletal: No joint deformity upper and lower extremities. Good ROM, no contractures. Normal muscle tone.  Neurologic: CN 2-12 grossly intact. Sensation intact.  Patient moving all 4 extremities spontaneously.  Patient is following all commands.  Patient is responsive to verbal stimuli.   Psychiatric: Patient exhibits normal mood with appropriate affect.  Patient seems to possess insight as to their current situation.     Labs on Admission: I have personally reviewed following labs and imaging studies -   CBC: Recent Labs  Lab 03/04/21 1436  WBC 9.7  HGB 12.2  HCT 36.9  MCV 82.4  PLT 280   Basic Metabolic  Panel: Recent Labs  Lab 03/04/21 1436  NA 139  K 4.3  CL 102  CO2 26  GLUCOSE 107*  BUN 12  CREATININE 0.85  CALCIUM 9.6   GFR: Estimated Creatinine Clearance: 134 mL/min (by C-G formula based on SCr of 0.85 mg/dL). Liver Function Tests: Recent Labs  Lab 03/04/21 1436  AST 436*  ALT 198*  ALKPHOS 98  BILITOT 3.7*  PROT 8.2*  ALBUMIN 4.3   Recent Labs  Lab 03/04/21 1436  LIPASE 28   No results for input(s): AMMONIA in the last 168 hours. Coagulation Profile: No results for input(s): INR, PROTIME in the last 168 hours. Cardiac Enzymes: No results for input(s): CKTOTAL, CKMB, CKMBINDEX, TROPONINI in the last 168 hours. BNP (last 3 results) No results for input(s): PROBNP in the last 8760 hours. HbA1C: No results for input(s): HGBA1C in the last 72 hours. CBG: No results for input(s): GLUCAP in the last 168 hours. Lipid Profile: No results for input(s): CHOL, HDL, LDLCALC, TRIG, CHOLHDL, LDLDIRECT in the last 72 hours. Thyroid Function Tests: No results  for input(s): TSH, T4TOTAL, FREET4, T3FREE, THYROIDAB in the last 72 hours. Anemia Panel: No results for input(s): VITAMINB12, FOLATE, FERRITIN, TIBC, IRON, RETICCTPCT in the last 72 hours. Urine analysis:    Component Value Date/Time   COLORURINE AMBER (A) 03/04/2021 1710   APPEARANCEUR CLEAR (A) 03/04/2021 1710   LABSPEC 1.032 (H) 03/04/2021 1710   PHURINE 7.0 03/04/2021 1710   GLUCOSEU NEGATIVE 03/04/2021 1710   HGBUR NEGATIVE 03/04/2021 1710   BILIRUBINUR SMALL (A) 03/04/2021 1710   KETONESUR NEGATIVE 03/04/2021 1710   PROTEINUR 30 (A) 03/04/2021 1710   NITRITE NEGATIVE 03/04/2021 1710   LEUKOCYTESUR NEGATIVE 03/04/2021 1710    Radiological Exams on Admission - Personally Reviewed: US Abdomen Limited RUQ (LIVER/GB)  Result Date: 03/04/2021 CLINICAL DATA:  Right upper quadrant pain for 2 days EXAM: ULTRASOUND ABDOMEN LIMITED RIGHT UPPER QUADRANT COMPARISON:  None. FINDINGS: Gallbladder: Cholelithiasis  with small stones in the dependent gallbladder. Largest stone measures 8 mm. No gallbladder wall thickening or edema. Murphy's sign is negative. Common bile duct: Diameter: 6 mm. Diameter is upper limits of normal but there appears to be an echogenic stone in the distal common bile duct. Liver: No focal lesion identified. Within normal limits in parenchymal echogenicity. Portal vein is patent on color Doppler imaging with normal direction of blood flow towards the liver. Other: None. IMPRESSION: Cholelithiasis. Probable common bile duct stone without obvious obstruction. Electronically Signed   By: Burman Nieves M.D.   On: 03/04/2021 18:10    EKG: Personally reviewed.  Rhythm is normal sinus rhythm with heart rate of 63 bpm.  No dynamic ST segment changes appreciated.  Assessment/Plan Principal Problem:   Choledocholithiasis  Patient presenting with several months of increasing pains, now resulting in an episode of severe intractable pain with inability to tolerate oral intake Right upper quadrant ultrasound concerning for common bile duct obstruction with evidence of elevated transaminases on LFTs. No clinical evidence of cholangitis Due to patient's inability to tolerate oral intake, gastroenterology has recommended transfer to [facility] for continued GI evaluation there and performance of ERCP Dr. Meridee Score with gastroenterology to evaluate patient in the morning for possible ERCP N.p.o. after midnight Intravenous isotonic fluids As needed antiemetics and opiate-based analgesics for associated symptoms    Code Status:  Full code Family Communication: Mother is at bedside who has been updated on plan of care  Status is: Observation  The patient remains OBS appropriate and will d/c before 2 midnights.  Dispo: The patient is from: Home              Anticipated d/c is to: Home              Patient currently is not medically stable to d/c.   Difficult to place patient  No        Marinda Elk MD [facility] Pager [phone]  If 7PM-7AM, please contact night-coverage [email or web address] Use universal  password for that web site. If you do not have the password, please call the hospital operator.  03/05/2021, 1:12 AM

## 2021-03-05 NOTE — Progress Notes (Signed)
PROGRESS NOTE  Brief Narrative: Isabel Brewer is a 30 y.o. female with a history of PCOS, and G1P1 s/p preterm SVD 12/30/2020 at 35w who presented to [facility] ED with worsening abdominal pain starting as pressure in the epigastrium, turning to RUQ pain, nausea and vomiting. This episode was similar to prior episodes over the past several months but they've been getting longer and more severe. In the ED, U/S demonstrated cholelithiasis with high-normal CBD diameter 35mm and echogenic CBD stone without obvious obstruction. AST 436, ALT 198, TBili 3.7. ERCP was recommended, not available at [facility], so the patient was transferred to [facility] and admitted early this morning by Dr. Leafy Half. GI has recommended MRI/MRCP which is pending, possible ERCP, and surgery consultation.   Subjective: Abdominal pain improved now that she's NPO. She is hungry, however. No nausea or vomiting.   Objective: BP 130/78 (BP Location: Left Arm)   Pulse 61   Temp 97.7 F (36.5 C) (Oral)   Resp 19   Ht 5\' 10"  (1.778 m)   Wt 122.4 kg   SpO2 100%   BMI 38.72 kg/m   Gen: Pleasant female in no distress Pulm: Clear and nonlabored on room air  CV: RRR, no murmur, no JVD, no edema GI: Soft, some RUQ tenderness without rebound, ND, +BS  Neuro: Alert and oriented. No focal deficits. Skin: No rashes, lesions or ulcers  Assessment & Plan: Principal Problem:   Choledocholithiasis  Choledocholithiasis, symptomatic cholelithiasis:  - GI consulted, remaining NPO pending MRI/MRCP results for possible ERCP later today.  - General surgery consulted - Trend LFTs  Postpartum s/p preterm NSVD 12/30/2020, currently breastfeeding.  Please see H&P from this morning for full details. Patient not likely to discharge today. Planning further workup as inpatient per GI and general surgery consultation for cholecystectomy. Will convert to inpatient status.   03/01/2021, MD Pager on amion 03/05/2021, 11:17 AM

## 2021-03-05 NOTE — Consult Note (Signed)
Isabel BisKandice Maish 1990-10-15  [id number].    Requesting MD: Dr. Jarvis NewcomerGrunz Chief Complaint/Reason for Consult: Epigastric and RUQ abdominal pain/Symptomatic Cholelithiasis   HPI: Isabel Brewer is a 30 y.o. female who recently had a vaginal birth to a baby boy in April 2022 and is currently breast-feeding who presented to [facility] ED on 6/15 with epigastric and right upper quadrant abdominal pain.  Patient reports during her pregnancy she would have intermittent episodes of epigastric and right upper quadrant abdominal pain that would radiate to her back and right scapula that she thought was heartburn.  She reports the episodes would typically be brought on after eating and would last approximately 30 minutes.  She notes after delivery she has had 3 severe episodes of similar pain that are  more intense and have progressively been lasting longer.  She recently started OTC Nexium, but is unsure if this provided any relief. On the evening of 6/14, after eating she had her most severe episode of epigastric and right upper quadrant abdominal pain with radiation to her back and right scapula that was constant and now associated with nausea as well as 1 episode of emesis.  Since presentation she has been found to have normal wbc, normal lipase, elevated LFT's (AST 436 > 433, ALT 198 > 268, T. Bili 3.7 > 3.6) and a RUQ US with cholelithiasis without evidence of acute cholecystitis but possible choledocholithiasis. GI was consulted and has ordered and MRCP. General surgery was asked to see.   Currently patient has mild soreness of the epigastrium but notes her pain and nausea has resolved. She denies any prior abdominal surgeries. She is not on any blood thinners.    ROS: Review of Systems  Constitutional:  Negative for chills and fever.  Respiratory:  Negative for shortness of breath.   Gastrointestinal:  Positive for abdominal pain, nausea and vomiting. Negative for constipation and diarrhea.   Psychiatric/Behavioral:  Negative for substance abuse.   All other systems reviewed and are negative.  Family History  Problem Relation Age of Onset   Alcohol abuse Father    Colon cancer Maternal Grandmother    Diabetes Mellitus II Mother    Hypertension Mother    Obesity Mother    Uterine cancer Mother     Past Medical History:  Diagnosis Date   Abnormal Pap smear of cervix 12/2017   Chronic hypertension    History of colposcopy 2019    Past Surgical History:  Procedure Laterality Date   ADENOIDECTOMY      Social History:  reports that she has never smoked. She has never used smokeless tobacco. She reports previous alcohol use. She reports that she does not use drugs. No alcohol use No tobacco use No illicit drug use Lives at home with her mother Not currently working   Allergies: No Known Allergies  Medications Prior to Admission  Medication Sig Dispense Refill   esomeprazole (NEXIUM) 20 MG capsule Take 20 mg by mouth daily at 12 noon.     ibuprofen (ADVIL) 600 MG tablet Take 1 tablet (600 mg total) by mouth every 6 (six) hours. (Patient taking differently: Take 600 mg by mouth every 6 (six) hours as needed for mild pain.) 30 tablet 0   Prenatal Vit-Fe Fumarate-FA (MULTIVITAMIN-PRENATAL) 27-0.8 MG TABS tablet Take 1 tablet by mouth daily at 12 noon.     benzocaine-Menthol (DERMOPLAST) 20-0.5 % AERO Apply 1 application topically as needed for irritation (perineal discomfort).     coconut oil OIL  Apply 1 application topically as needed.  0   docusate sodium (COLACE) 100 MG capsule Take 1 capsule (100 mg total) by mouth 2 (two) times daily. 30 capsule 0   ferrous sulfate 325 (65 FE) MG tablet Take 1 tablet (325 mg total) by mouth 2 (two) times daily with a meal. 60 tablet 0   NIFEdipine (ADALAT CC) 30 MG 24 hr tablet Take 30 mg by mouth daily.     simethicone (MYLICON) 80 MG chewable tablet Chew 1 tablet (80 mg total) by mouth as needed for flatulence. 30 tablet 0    witch hazel-glycerin (TUCKS) pad Apply 1 application topically as needed for hemorrhoids. 40 each 12     Physical Exam: Blood pressure 130/78, pulse 61, temperature 97.7 F (36.5 C), temperature source Oral, resp. rate 19, height 5\' 10"  (1.778 m), weight 122.4 kg, SpO2 100 %, currently breastfeeding. General: pleasant, WD/WN white female who is laying in bed in NAD HEENT: head is normocephalic, atraumatic.  Sclera are noninjected.  PERRL.  Ears and nose without any masses or lesions.  Mouth is pink and moist. Dentition fair Heart: regular, rate, and rhythm.  Normal s1,s2. No obvious murmurs, gallops, or rubs noted.  Palpable pedal pulses bilaterally  Lungs: CTAB, no wheezes, rhonchi, or rales noted.  Respiratory effort nonlabored Abd: Soft, ND, mild epigastric and ruq tenderness without peritonitis. NT otherwise. Negative Murphy's sign. No peritonitis. +BS, no masses, hernias, or organomegaly MS: no BUE/BLE edema, calves soft and nontender Skin: warm and dry with no masses, lesions, or rashes Psych: A&Ox4 with an appropriate affect Neuro: cranial nerves grossly intact, equal strength in BUE/BLE bilaterally, normal speech, though process intact, moves all extremities. Gait not assessed.   Results for orders placed or performed during the hospital encounter of 03/04/21 (from the past 48 hour(s))  Surgical pcr screen     Status: None   Collection Time: 03/05/21  1:17 AM   Specimen: Nasal Mucosa; Nasal Swab  Result Value Ref Range   MRSA, PCR NEGATIVE NEGATIVE   Staphylococcus aureus NEGATIVE NEGATIVE    Comment: (NOTE) The Xpert SA Assay (FDA approved for NASAL specimens in patients 23 years of age and older), is one component of a comprehensive surveillance program. It is not intended to diagnose infection nor to guide or monitor treatment. Performed at [facility], [address] [place], [place]   Comprehensive metabolic panel     Status: Abnormal   Collection Time:  03/05/21  4:13 AM  Result Value Ref Range   Sodium 139 135 - 145 mmol/L   Potassium 3.5 3.5 - 5.1 mmol/L   Chloride 107 98 - 111 mmol/L   CO2 26 22 - 32 mmol/L   Glucose, Bld 104 (H) 70 - 99 mg/dL    Comment: Glucose reference range applies only to samples taken after fasting for at least 8 hours.   BUN 9 6 - 20 mg/dL   Creatinine, Ser 03/07/21 0.44 - 1.00 mg/dL   Calcium 8.9 8.9 - 8.46 mg/dL   Total Protein 6.4 (L) 6.5 - 8.1 g/dL   Albumin 3.1 (L) 3.5 - 5.0 g/dL   AST 65.9 (H) 15 - 41 U/L   ALT 263 (H) 0 - 44 U/L   Alkaline Phosphatase 111 38 - 126 U/L   Total Bilirubin 3.6 (H) 0.3 - 1.2 mg/dL   GFR, Estimated 935 >70 mL/min    Comment: (NOTE) Calculated using the CKD-EPI Creatinine Equation (2021)    Anion  gap 6 5 - 15    Comment: Performed at [facility], [address], [place], [place] [address]  Magnesium     Status: None   Collection Time: 03/05/21  4:13 AM  Result Value Ref Range   Magnesium 2.0 1.7 - 2.4 mg/dL    Comment: Performed at [facility], [address], [place], [place] [address]  CBC WITH DIFFERENTIAL     Status: Abnormal   Collection Time: 03/05/21  4:13 AM  Result Value Ref Range   WBC 4.7 4.0 - 10.5 K/uL   RBC 4.05 3.87 - 5.11 MIL/uL   Hemoglobin 11.0 (L) 12.0 - 15.0 g/dL   HCT 86.5 (L) 78.4 - 69.6 %   MCV 84.0 80.0 - 100.0 fL   MCH 27.2 26.0 - 34.0 pg   MCHC 32.4 30.0 - 36.0 g/dL   RDW 29.5 28.4 - 13.2 %   Platelets 220 150 - 400 K/uL   nRBC 0.0 0.0 - 0.2 %   Neutrophils Relative % 60 %   Neutro Abs 2.8 1.7 - 7.7 K/uL   Lymphocytes Relative 25 %   Lymphs Abs 1.2 0.7 - 4.0 K/uL   Monocytes Relative 10 %   Monocytes Absolute 0.5 0.1 - 1.0 K/uL   Eosinophils Relative 4 %   Eosinophils Absolute 0.2 0.0 - 0.5 K/uL   Basophils Relative 1 %   Basophils Absolute 0.0 0.0 - 0.1 K/uL   Immature Granulocytes 0 %   Abs Immature Granulocytes 0.02 0.00 - 0.07 K/uL    Comment: Performed at [facility], [address] [place], [place]  [address]  APTT     Status: None   Collection Time: 03/05/21  4:13 AM  Result Value Ref Range   aPTT 29 24 - 36 seconds    Comment: Performed at [facility], [address], [place], [place] [address]  Protime-INR     Status: None   Collection Time: 03/05/21  4:13 AM  Result Value Ref Range   Prothrombin Time 14.2 11.4 - 15.2 seconds   INR 1.1 0.8 - 1.2    Comment: (NOTE) INR goal varies based on device and disease states. Performed at [facility], [address] [place], [place] [address]    US Abdomen Limited RUQ (LIVER/GB)  Result Date: 03/04/2021 CLINICAL DATA:  Right upper quadrant pain for 2 days EXAM: ULTRASOUND ABDOMEN LIMITED RIGHT UPPER QUADRANT COMPARISON:  None. FINDINGS: Gallbladder: Cholelithiasis with small stones in the dependent gallbladder. Largest stone measures 8 mm. No gallbladder wall thickening or edema. Murphy's sign is negative. Common bile duct: Diameter: 6 mm. Diameter is upper limits of normal but there appears to be an echogenic stone in the distal common bile duct. Liver: No focal lesion identified. Within normal limits in parenchymal echogenicity. Portal vein is patent on color Doppler imaging with normal direction of blood flow towards the liver. Other: None. IMPRESSION: Cholelithiasis. Probable common bile duct stone without obvious obstruction. Electronically Signed   By: Burman Nieves M.D.   On: 03/04/2021 18:10    Anti-infectives (From admission, onward)    None       Assessment/Plan Symptomatic Cholelithiasis Elevated LFT's with possible Choledocholithiasis - Will plan for Lap Chole during admission. Timing pending results of MRCP to determine if patient will need ERCP before Lap Chole  - No current evidence of acute cholecystitis on imaging. WBC wnl. Can hold off on abx at this time - Continue  to trend labs -  I have explained the procedure, risks, and aftercare of Laparoscopic cholecystectomy.  Risks include but are not limited to  anesthesia (MI, CVA, death), bleeding, infection, wound problems, hernia, bile leak, injury to common bile duct/liver/intestine and diarrhea post op. We also discussed that some of the medications given pre/post op may require her to have to pump and dump. She seems to understand and agrees to proceed.  FEN - NPO  VTE - SCDs, okay for chemical prophylaxis from a general surgery standpoint  ID - None   Recent vaginal birth to baby boy in April 2022 Breastfeeding GERD  Jacinto Halim, [facility] Surgery 03/05/2021, 11:31 AM Please see Amion for pager number during day hours 7:00am-4:30pm

## 2021-03-05 NOTE — Consult Note (Signed)
[facility] Consult: 9:06 AM 03/05/2021  LOS: 1 day    Referring Provider: Dr Jarvis Newcomer  Primary Care Physician:  Kandyce Rud MD in [place].   Primary Gastroenterologist:  unassigned transfer from [facility]    Reason for Consultation:  ? Choledocholithiasis.     HPI: Isabel Brewer is a 30 y.o. female.  PMH Hypertension.  Obesity.  Polycystic ovarian syndrome.  Abnormal Pap smear, colposcopy, biopsy 2019. 12/30/2020 spontaneous vaginal delivery.  RUQ pain that began shortly after eating in the evening 5/24.  Lasted many hours and subsided on its own by the time she was seen at [facility] ED. she was seen at the [facility] walk-in clinic but referred to the ED for further evaluation.  Vomited that morning.  Similar pain during pregnancy.  Has been taking Nexium daily for about a week after a similar 5 or 6-hour episode of pain.  Describes 4 episodes scattered over several months with similar pain in the right upper quadrant radiating to the shoulder and back and not associated with nausea or vomiting.  T bili 3.7 >> 3.6.  Alkaline phosphatase 98  >> 111.  AST/ALT 436/198 >> 433/263. WBCs, platelets, INR normal.  Hb 11, MCV 84.  Urine pregnancy negative. Abdominal ultrasound: Cholelithiasis.  CBD 6 mm with probable stone at distal CBD. MRCP: Completed, no report yet.  The abdominal pain has not recurred since resolving prior to arrival at the ED.  No nausea or recurrent vomiting.  Took 600 mg ibuprofen on 5/25 normally does not use much of this.   Does not consume alcoholic beverages or smoke.  Her baby is at home with his father. Mother status post cholecystectomy.  Past Medical History:  Diagnosis Date   Abnormal Pap smear of cervix 12/2017   Chronic hypertension    History of colposcopy 2019    Past  Surgical History:  Procedure Laterality Date   ADENOIDECTOMY      Prior to Admission medications   Medication Sig Start Date End Date Taking? Authorizing Provider  esomeprazole (NEXIUM) 20 MG capsule Take 20 mg by mouth daily at 12 noon.   Yes [provider]  ibuprofen (ADVIL) 600 MG tablet Take 1 tablet (600 mg total) by mouth every 6 (six) hours. Patient taking differently: Take 600 mg by mouth every 6 (six) hours as needed for mild pain. 01/01/21  Yes McVey, Prudencio Pair, CNM  Prenatal Vit-Fe Fumarate-FA (MULTIVITAMIN-PRENATAL) 27-0.8 MG TABS tablet Take 1 tablet by mouth daily at 12 noon.   Yes [provider]  benzocaine-Menthol (DERMOPLAST) 20-0.5 % AERO Apply 1 application topically as needed for irritation (perineal discomfort). 01/01/21   McVey, Prudencio Pair, CNM  coconut oil OIL Apply 1 application topically as needed. 01/01/21   McVey, Prudencio Pair, CNM  docusate sodium (COLACE) 100 MG capsule Take 1 capsule (100 mg total) by mouth 2 (two) times daily. 01/01/21   McVey, Prudencio Pair, CNM  ferrous sulfate 325 (65 FE) MG tablet Take 1 tablet (325 mg total) by mouth 2 (two) times daily with a meal. 01/01/21  McVey, Rebecca A, CNM  NIFEdipine (ADALAT CC) 30 MG 24 hr tablet Take 30 mg by mouth daily.    [provider]  simethicone (MYLICON) 80 MG chewable tablet Chew 1 tablet (80 mg total) by mouth as needed for flatulence. 01/01/21   McVey, Prudencio Pair, CNM  witch hazel-glycerin (TUCKS) pad Apply 1 application topically as needed for hemorrhoids. 01/01/21   McVey, Prudencio Pair, CNM    Scheduled Meds:  pantoprazole (PROTONIX) IV  40 mg Intravenous Daily   Infusions:  lactated ringers 125 mL/hr at 03/05/21 0538   PRN Meds: acetaminophen **OR** acetaminophen, oxyCODONE-acetaminophen **OR** morphine injection, ondansetron **OR** ondansetron (ZOFRAN) IV, polyethylene glycol   Allergies as of 03/04/2021   (No Known Allergies)    Family History  Problem Relation Age of Onset    Alcohol abuse Father    Colon cancer Maternal Grandmother    Diabetes Mellitus II Mother    Hypertension Mother    Obesity Mother    Uterine cancer Mother     Social History   Socioeconomic History   Marital status: Single    Spouse name: Not on file   Number of children: Not on file   Years of education: Not on file   Highest education level: Not on file  Occupational History   Not on file  Tobacco Use   Smoking status: Never   Smokeless tobacco: Never  Substance and Sexual Activity   Alcohol use: Not Currently   Drug use: Never   Sexual activity: Yes    Comment: undecided  Other Topics Concern   Not on file  Social History Narrative   Not on file   Social Determinants of Health   Financial Resource Strain: Not on file  Food Insecurity: Not on file  Transportation Needs: Not on file  Physical Activity: Not on file  Stress: Not on file  Social Connections: Not on file  Intimate Partner Violence: Not on file    REVIEW OF SYSTEMS: Constitutional: No malaise, no profound weakness or fatigue ENT:  No nose bleeds Pulm: No shortness of breath or cough. CV:  No palpitations, no LE edema.  No angina. GU:  No hematuria, no frequency.  No dark-colored urine. GI: See HPI. Heme: See HPI. Transfusions: None Neuro:  No headaches, no peripheral tingling or numbness.  No seizures, no syncope. Derm:  No itching, no rash or sores.  Endocrine:  No sweats or chills.  No polyuria or dysuria Immunization: Reviewed Travel:  None beyond local counties in last few months.    PHYSICAL EXAM: Vital signs in last 24 hours: Vitals:   03/05/21 0222 03/05/21 0550  BP: 119/75 130/78  Pulse: 66 61  Resp: 20 19  Temp: 97.6 F (36.4 C) 97.7 F (36.5 C)  SpO2: 98% 100%   Wt Readings from Last 3 Encounters:  03/04/21 122.4 kg  03/04/21 118.4 kg  12/30/20 132.5 kg    General: Comfortable, well-appearing, overweight, alert. Head: No facial asymmetry or swelling.  No signs of  head trauma. Eyes: Very mild scleral icterus.  No conjunctival pallor.  EOMI Ears: Not hard of hearing. Nose: No congestion or discharge. Mouth: Tongue midline.  Mucosa moist, pink, clear. Neck: No JVD, no masses, no thyromegaly. Lungs: Clear bilaterally without labored breathing or cough. Heart: RRR.  No MRG. Abdomen: Soft.  Minimal tenderness in the right upper quad (says significantly improved from exam by providers yesterday).  Active bowel sounds.  No HSM, masses, bruits, hernias Rectal: Firm Musc/Skeltl: No  obvious joint deformities.  No joint swelling or redness. Extremities: No CCE. Neurologic: Fully alert and oriented.  Moves all 4 limbs without weakness or tremor. Skin: No jaundice.  No bruising. Nodes: No cervical adenopathy Psych: Cooperative, calm, pleasant, in good spirits.  Fluid speech.  Intake/Output from previous day: 06/15 0701 - 06/16 0700 In: 520.6 [I.V.:520.6] Out: -  Intake/Output this shift: No intake/output data recorded.  LAB RESULTS: Recent Labs    03/04/21 1436 03/05/21 0413  WBC 9.7 4.7  HGB 12.2 11.0*  HCT 36.9 34.0*  PLT 280 220   BMET Lab Results  Component Value Date   NA 139 03/05/2021   NA 139 03/04/2021   NA 136 12/30/2020   K 3.5 03/05/2021   K 4.3 03/04/2021   K 3.7 12/30/2020   CL 107 03/05/2021   CL 102 03/04/2021   CL 104 12/30/2020   CO2 26 03/05/2021   CO2 26 03/04/2021   CO2 20 (L) 12/30/2020   GLUCOSE 104 (H) 03/05/2021   GLUCOSE 107 (H) 03/04/2021   GLUCOSE 113 (H) 12/30/2020   BUN 9 03/05/2021   BUN 12 03/04/2021   BUN 5 (L) 12/30/2020   CREATININE 0.77 03/05/2021   CREATININE 0.85 03/04/2021   CREATININE 0.50 12/30/2020   CALCIUM 8.9 03/05/2021   CALCIUM 9.6 03/04/2021   CALCIUM 9.1 12/30/2020   LFT Recent Labs    03/04/21 1436 03/05/21 0413  PROT 8.2* 6.4*  ALBUMIN 4.3 3.1*  AST 436* 433*  ALT 198* 263*  ALKPHOS 98 111  BILITOT 3.7* 3.6*   PT/INR Lab Results  Component Value Date   INR 1.1  03/05/2021   Hepatitis Panel No results for input(s): HEPBSAG, HCVAB, HEPAIGM, HEPBIGM in the last 72 hours. C-Diff No components found for: CDIFF Lipase     Component Value Date/Time   LIPASE 28 03/04/2021 1436    Drugs of Abuse  No results found for: LABOPIA, COCAINSCRNUR, LABBENZ, AMPHETMU, THCU, LABBARB   RADIOLOGY STUDIES: US Abdomen Limited RUQ (LIVER/GB)  Result Date: 03/04/2021 CLINICAL DATA:  Right upper quadrant pain for 2 days EXAM: ULTRASOUND ABDOMEN LIMITED RIGHT UPPER QUADRANT COMPARISON:  None. FINDINGS: Gallbladder: Cholelithiasis with small stones in the dependent gallbladder. Largest stone measures 8 mm. No gallbladder wall thickening or edema. Murphy's sign is negative. Common bile duct: Diameter: 6 mm. Diameter is upper limits of normal but there appears to be an echogenic stone in the distal common bile duct. Liver: No focal lesion identified. Within normal limits in parenchymal echogenicity. Portal vein is patent on color Doppler imaging with normal direction of blood flow towards the liver. Other: None. IMPRESSION: Cholelithiasis. Probable common bile duct stone without obvious obstruction. Electronically Signed   By: Burman Nieves M.D.   On: 03/04/2021 18:10      IMPRESSION:      Biliary colic.  Ultrasound confirms cholelithiasis and possible CBD stone. Similar episodes but not as prolonged or severe during pregnancy and again last week. LFTs rising.     PLAN:        Please ask general surgery to see patient.  She will need lap chole regardless of whether she has ERCP or not.   Awaiting reading of MRCP in order to determine next steps.   Maintain n.p.o. in case ERCP necessary and can be arranged for this afternoon.   Jennye Moccasin  03/05/2021, 9:06 AM Phone [phone]

## 2021-03-06 ENCOUNTER — Encounter (HOSPITAL_COMMUNITY)
Admission: AD | Disposition: A | Discharge status: Home / Self Care | Payer: Self-pay | Source: Other Acute Inpatient Hospital | Attending: Internal Medicine

## 2021-03-06 ENCOUNTER — Inpatient Hospital Stay (HOSPITAL_COMMUNITY): Payer: Medicaid Other | Admitting: Registered Nurse

## 2021-03-06 ENCOUNTER — Inpatient Hospital Stay (HOSPITAL_COMMUNITY): Payer: Medicaid Other

## 2021-03-06 HISTORY — PX: CHOLECYSTECTOMY: SHX55

## 2021-03-06 LAB — COMPREHENSIVE METABOLIC PANEL
ALT: 178 U/L — ABNORMAL HIGH (ref 0–44)
AST: 105 U/L — ABNORMAL HIGH (ref 15–41)
Albumin: 3.2 g/dL — ABNORMAL LOW (ref 3.5–5.0)
Alkaline Phosphatase: 101 U/L (ref 38–126)
Anion gap: 7 (ref 5–15)
BUN: 6 mg/dL (ref 6–20)
CO2: 27 mmol/L (ref 22–32)
Calcium: 9.1 mg/dL (ref 8.9–10.3)
Chloride: 104 mmol/L (ref 98–111)
Creatinine, Ser: 0.76 mg/dL (ref 0.44–1.00)
GFR, Estimated: >60 mL/min (ref >60)
Glucose, Bld: 90 mg/dL (ref 70–99)
Potassium: 3.5 mmol/L (ref 3.5–5.1)
Sodium: 138 mmol/L (ref 135–145)
Total Bilirubin: 1.7 mg/dL — ABNORMAL HIGH (ref 0.3–1.2)
Total Protein: 6.1 g/dL — ABNORMAL LOW (ref 6.5–8.1)

## 2021-03-06 SURGERY — ERCP, WITH INTERVENTION IF INDICATED
Anesthesia: General

## 2021-03-06 SURGERY — LAPAROSCOPIC CHOLECYSTECTOMY WITH INTRAOPERATIVE CHOLANGIOGRAM
Anesthesia: General | Site: Abdomen

## 2021-03-06 MED ORDER — PROPOFOL 10 MG/ML IV BOLUS
INTRAVENOUS | Status: AC
  Filled 2021-03-06: qty 20

## 2021-03-06 MED ORDER — CEFAZOLIN SODIUM 1 G IJ SOLR
INTRAMUSCULAR | Status: AC
  Filled 2021-03-06: qty 20

## 2021-03-06 MED ORDER — DEXAMETHASONE SODIUM PHOSPHATE 10 MG/ML IJ SOLN
INTRAMUSCULAR | Status: DC | PRN
  Administered 2021-03-06: 5 mg via INTRAVENOUS

## 2021-03-06 MED ORDER — HYDROMORPHONE HCL 1 MG/ML IJ SOLN
0.2500 mg | INTRAMUSCULAR | Status: DC | PRN
  Administered 2021-03-06: 0.25 mg via INTRAVENOUS

## 2021-03-06 MED ORDER — LIDOCAINE HCL (PF) 2 % IJ SOLN
INTRAMUSCULAR | Status: AC
  Filled 2021-03-06: qty 5

## 2021-03-06 MED ORDER — AMISULPRIDE (ANTIEMETIC) 5 MG/2ML IV SOLN: 10.0000 mg | Freq: Once | INTRAVENOUS | Status: DC | PRN

## 2021-03-06 MED ORDER — CEFAZOLIN SODIUM-DEXTROSE 2-3 GM-%(50ML) IV SOLR: INTRAVENOUS | Status: DC | PRN

## 2021-03-06 MED ORDER — OXYCODONE HCL 5 MG/5ML PO SOLN: 5.0000 mg | Freq: Once | ORAL | Status: DC | PRN

## 2021-03-06 MED ORDER — OXYCODONE HCL 5 MG PO TABS: 5.0000 mg | ORAL_TABLET | Freq: Once | ORAL | Status: DC | PRN

## 2021-03-06 MED ORDER — ROCURONIUM BROMIDE 10 MG/ML (PF) SYRINGE
PREFILLED_SYRINGE | INTRAVENOUS | Status: AC
  Filled 2021-03-06: qty 10

## 2021-03-06 MED ORDER — ROCURONIUM BROMIDE 10 MG/ML (PF) SYRINGE
PREFILLED_SYRINGE | INTRAVENOUS | Status: DC | PRN
  Administered 2021-03-06: 100 mg via INTRAVENOUS

## 2021-03-06 MED ORDER — PROMETHAZINE HCL 25 MG/ML IJ SOLN: 6.2500 mg | INTRAMUSCULAR | Status: DC | PRN

## 2021-03-06 MED ORDER — LABETALOL HCL 5 MG/ML IV SOLN
INTRAVENOUS | Status: DC | PRN
  Administered 2021-03-06: 10 mg via INTRAVENOUS

## 2021-03-06 MED ORDER — SODIUM CHLORIDE 0.9 % IV SOLN: INTRAVENOUS | Status: DC

## 2021-03-06 MED ORDER — FENTANYL CITRATE (PF) 250 MCG/5ML IJ SOLN
INTRAMUSCULAR | Status: DC | PRN
  Administered 2021-03-06 (×2): 50 ug via INTRAVENOUS
  Administered 2021-03-06: 100 ug via INTRAVENOUS

## 2021-03-06 MED ORDER — FENTANYL CITRATE (PF) 250 MCG/5ML IJ SOLN
INTRAMUSCULAR | Status: AC
  Filled 2021-03-06: qty 5

## 2021-03-06 MED ORDER — DEXAMETHASONE SODIUM PHOSPHATE 10 MG/ML IJ SOLN
INTRAMUSCULAR | Status: AC
  Filled 2021-03-06: qty 1

## 2021-03-06 MED ORDER — DIPHENHYDRAMINE HCL 50 MG/ML IJ SOLN
INTRAMUSCULAR | Status: AC
  Filled 2021-03-06: qty 1

## 2021-03-06 MED ORDER — SODIUM CHLORIDE (PF) 0.9 % IJ SOLN
INTRAVENOUS | Status: DC | PRN
  Administered 2021-03-06: 7 mL

## 2021-03-06 MED ORDER — MIDAZOLAM HCL 2 MG/2ML IJ SOLN
INTRAMUSCULAR | Status: AC
  Filled 2021-03-06: qty 2

## 2021-03-06 MED ORDER — LACTATED RINGERS IV SOLN: INTRAVENOUS | Status: DC | PRN

## 2021-03-06 MED ORDER — SODIUM CHLORIDE 0.9 % IR SOLN
Status: DC | PRN
  Administered 2021-03-06: 1000 mL

## 2021-03-06 MED ORDER — MEPERIDINE HCL 25 MG/ML IJ SOLN: 6.2500 mg | INTRAMUSCULAR | Status: DC | PRN

## 2021-03-06 MED ORDER — HYDROMORPHONE HCL 1 MG/ML IJ SOLN
INTRAMUSCULAR | Status: AC
  Filled 2021-03-06: qty 1

## 2021-03-06 MED ORDER — ONDANSETRON HCL 4 MG/2ML IJ SOLN
INTRAMUSCULAR | Status: AC
  Filled 2021-03-06: qty 2

## 2021-03-06 MED ORDER — SUGAMMADEX SODIUM 200 MG/2ML IV SOLN
INTRAVENOUS | Status: DC | PRN
  Administered 2021-03-06 (×4): 100 mg via INTRAVENOUS

## 2021-03-06 MED ORDER — BUPIVACAINE-EPINEPHRINE (PF) 0.25% -1:200000 IJ SOLN
INTRAMUSCULAR | Status: AC
  Filled 2021-03-06: qty 30

## 2021-03-06 MED ORDER — 0.9 % SODIUM CHLORIDE (POUR BTL) OPTIME
TOPICAL | Status: DC | PRN
  Administered 2021-03-06: 1000 mL

## 2021-03-06 MED ORDER — BUPIVACAINE-EPINEPHRINE 0.25% -1:200000 IJ SOLN
INTRAMUSCULAR | Status: DC | PRN
  Administered 2021-03-06: 20 mL

## 2021-03-06 MED ORDER — DIPHENHYDRAMINE HCL 50 MG/ML IJ SOLN
INTRAMUSCULAR | Status: DC | PRN
  Administered 2021-03-06: 12.5 mg via INTRAVENOUS

## 2021-03-06 MED ORDER — PROPOFOL 10 MG/ML IV BOLUS
INTRAVENOUS | Status: DC | PRN
  Administered 2021-03-06: 180 mg via INTRAVENOUS

## 2021-03-06 MED ORDER — LIDOCAINE HCL (PF) 2 % IJ SOLN
INTRAMUSCULAR | Status: DC | PRN
  Administered 2021-03-06: 100 mg via INTRADERMAL

## 2021-03-06 MED ORDER — MIDAZOLAM HCL 2 MG/2ML IJ SOLN
INTRAMUSCULAR | Status: DC | PRN
  Administered 2021-03-06: 2 mg via INTRAVENOUS

## 2021-03-06 MED ORDER — ONDANSETRON HCL 4 MG/2ML IJ SOLN
INTRAMUSCULAR | Status: DC | PRN
  Administered 2021-03-06: 4 mg via INTRAVENOUS

## 2021-03-06 SURGICAL SUPPLY — 43 items
APPLIER CLIP 5 13 M/L LIGAMAX5 (MISCELLANEOUS) ×3
CANISTER SUCT 3000ML PPV (MISCELLANEOUS) ×3 IMPLANT
CATH REDDICK CHOLANGI 4FR 50CM (CATHETERS) ×3 IMPLANT
CHLORAPREP W/TINT 26 (MISCELLANEOUS) ×3 IMPLANT
CLIP APPLIE 5 13 M/L LIGAMAX5 (MISCELLANEOUS) ×1 IMPLANT
COVER MAYO STAND STRL (DRAPES) ×3 IMPLANT
COVER SURGICAL LIGHT HANDLE (MISCELLANEOUS) ×3 IMPLANT
DERMABOND ADVANCED (GAUZE/BANDAGES/DRESSINGS) ×2
DERMABOND ADVANCED .7 DNX12 (GAUZE/BANDAGES/DRESSINGS) ×1 IMPLANT
DRAPE C-ARM 42X120 X-RAY (DRAPES) ×3 IMPLANT
ELECT REM PT RETURN 9FT ADLT (ELECTROSURGICAL) ×3
ELECTRODE REM PT RTRN 9FT ADLT (ELECTROSURGICAL) ×1 IMPLANT
GLOVE BIO SURGEON STRL SZ8 (GLOVE) ×3 IMPLANT
GLOVE SRG 8 PF TXTR STRL LF DI (GLOVE) ×1 IMPLANT
GLOVE SURG UNDER POLY LF SZ8 (GLOVE) ×2
GOWN STRL REUS W/ TWL LRG LVL3 (GOWN DISPOSABLE) ×2 IMPLANT
GOWN STRL REUS W/ TWL XL LVL3 (GOWN DISPOSABLE) ×1 IMPLANT
GOWN STRL REUS W/TWL LRG LVL3 (GOWN DISPOSABLE) ×4
GOWN STRL REUS W/TWL XL LVL3 (GOWN DISPOSABLE) ×2
IV CATH 14GX2 1/4 (CATHETERS) ×3 IMPLANT
KIT BASIN OR (CUSTOM PROCEDURE TRAY) ×3 IMPLANT
KIT TURNOVER KIT B (KITS) ×3 IMPLANT
L-HOOK LAP DISP 36CM (ELECTROSURGICAL) ×3
LHOOK LAP DISP 36CM (ELECTROSURGICAL) ×1 IMPLANT
NEEDLE 22X1 1/2 (OR ONLY) (NEEDLE) ×3 IMPLANT
NS IRRIG 1000ML POUR BTL (IV SOLUTION) ×3 IMPLANT
PAD ARMBOARD 7.5X6 YLW CONV (MISCELLANEOUS) ×3 IMPLANT
PENCIL BUTTON HOLSTER BLD 10FT (ELECTRODE) ×3 IMPLANT
POUCH RETRIEVAL ECOSAC 10 (ENDOMECHANICALS) ×1 IMPLANT
POUCH RETRIEVAL ECOSAC 10MM (ENDOMECHANICALS) ×2
SCISSORS LAP 5X35 DISP (ENDOMECHANICALS) ×3 IMPLANT
SET CHOLANGIOGRAPH MIX (MISCELLANEOUS) ×3 IMPLANT
SET IRRIG TUBING LAPAROSCOPIC (IRRIGATION / IRRIGATOR) ×3 IMPLANT
SET TUBE SMOKE EVAC HIGH FLOW (TUBING) ×3 IMPLANT
SLEEVE ENDOPATH XCEL 5M (ENDOMECHANICALS) ×6 IMPLANT
SPECIMEN JAR SMALL (MISCELLANEOUS) ×3 IMPLANT
SUT VIC AB 4-0 PS2 27 (SUTURE) ×3 IMPLANT
TOWEL GREEN STERILE (TOWEL DISPOSABLE) ×3 IMPLANT
TOWEL GREEN STERILE FF (TOWEL DISPOSABLE) ×3 IMPLANT
TRAY LAPAROSCOPIC MC (CUSTOM PROCEDURE TRAY) ×3 IMPLANT
TROCAR XCEL BLUNT TIP 100MML (ENDOMECHANICALS) ×3 IMPLANT
TROCAR XCEL NON-BLD 5MMX100MML (ENDOMECHANICALS) ×3 IMPLANT
WATER STERILE IRR 1000ML POUR (IV SOLUTION) ×3 IMPLANT

## 2021-03-06 NOTE — Transfer of Care (Signed)
Immediate Anesthesia Transfer of Care Note  Patient: Isabel Brewer  Procedure(s) Performed: LAPAROSCOPIC CHOLECYSTECTOMY WITH ATTEMPTED INTRAOPERATIVE CHOLANGIOGRAM (Abdomen)  Patient Location: PACU  Anesthesia Type:General  Level of Consciousness: awake, alert , oriented and drowsy  Airway & Oxygen Therapy: Patient Spontanous Breathing  Post-op Assessment: Report given to RN and Post -op Vital signs reviewed and stable  Post vital signs: Reviewed and stable  Last Vitals:  Vitals Value Taken Time  BP 137/85 03/06/21 0913  Temp    Pulse 79 03/06/21 0916  Resp 15 03/06/21 0916  SpO2 100 % 03/06/21 0916  Vitals shown include unvalidated device data.  Last Pain:  Vitals:   03/06/21 0529  TempSrc: Oral  PainSc:       Patients Stated Pain Goal: 3 (03/04/21 2300)  Complications: No notable events documented.

## 2021-03-06 NOTE — Anesthesia Postprocedure Evaluation (Signed)
Anesthesia Post Note  Patient: Isabel Brewer  Procedure(s) Performed: LAPAROSCOPIC CHOLECYSTECTOMY WITH ATTEMPTED INTRAOPERATIVE CHOLANGIOGRAM (Abdomen)     Patient location during evaluation: PACU Anesthesia Type: General Level of consciousness: awake and alert Pain management: pain level controlled Vital Signs Assessment: post-procedure vital signs reviewed and stable Respiratory status: spontaneous breathing, nonlabored ventilation and respiratory function stable Cardiovascular status: blood pressure returned to baseline and stable Postop Assessment: no apparent nausea or vomiting Anesthetic complications: no   No notable events documented.  Last Vitals:  Vitals:   03/06/21 0943 03/06/21 1013  BP: (!) 141/81 (!) 146/86  Pulse: (!) 59 61  Resp: 14 20  Temp: 36.8 C 36.7 C  SpO2: 98% 100%    Last Pain:  Vitals:   03/06/21 1013  TempSrc: Oral  PainSc:                  Lowella Curb

## 2021-03-06 NOTE — Progress Notes (Signed)
Review of today's labs show significant improvement in LFT pattern and downtrending transferases and bilirubin.  Plan from yesterday per surgery to consider CCK + IOC, which I agree is very reasonable next step.  If IOC is positive, then please let our team know so that we can arrange an ERCP this weekend.  We will cancel the EUS/ERCP tentative hold that we have on the books currently.  [facility] will be available as needed.  Corliss Parish, MD [facility] Advanced Endoscopy Office # [phone]

## 2021-03-06 NOTE — Progress Notes (Signed)
Patient ID: Isabel Brewer, female   DOB: Oct 10, 1990, 30 y.o.   MRN: [id number] Day of Surgery   Subjective: In pre-op, no changes ROS negative except as listed above. Objective: Vital signs in last 24 hours: Temp:  [98 F (36.7 C)-98.2 F (36.8 C)] 98 F (36.7 C) (06/17 0529) Pulse Rate:  [55-64] 61 (06/17 0529) Resp:  [16-18] 16 (06/17 0529) BP: (129-142)/(77-93) 142/93 (06/17 0529) SpO2:  [98 %-100 %] 100 % (06/17 0529) Last BM Date: 03/04/21  Intake/Output from previous day: 06/16 0701 - 06/17 0700 In: 1171.7 [P.O.:320; I.V.:851.7] Out: -  Intake/Output this shift: Total I/O In: 1000.4 [P.O.:320; I.V.:680.4] Out: -   General appearance: alert and cooperative Resp: clear to auscultation bilaterally Cardio: regular rate and rhythm GI: soft, NT  Lab Results: CBC  Recent Labs    03/04/21 1436 03/05/21 0413  WBC 9.7 4.7  HGB 12.2 11.0*  HCT 36.9 34.0*  PLT 280 220   BMET Recent Labs    03/05/21 0413 03/06/21 0137  NA 139 138  K 3.5 3.5  CL 107 104  CO2 26 27  GLUCOSE 104* 90  BUN 9 6  CREATININE 0.77 0.76  CALCIUM 8.9 9.1   PT/INR Recent Labs    03/05/21 0413  LABPROT 14.2  INR 1.1   ABG No results for input(s): PHART, HCO3 in the last 72 hours.  Invalid input(s): PCO2, PO2  Studies/Results: MR ABDOMEN MRCP W WO CONTAST  Result Date: 03/05/2021 CLINICAL DATA:  Abdominal pain with nausea and vomiting, concern for choledocholithiasis seen on prior ultrasound. EXAM: MRI ABDOMEN WITHOUT AND WITH CONTRAST (INCLUDING MRCP) TECHNIQUE: Multiplanar multisequence MR imaging of the abdomen was performed both before and after the administration of intravenous contrast. Heavily T2-weighted images of the biliary and pancreatic ducts were obtained, and three-dimensional MRCP images were rendered by post processing. CONTRAST:  56mL GADAVIST GADOBUTROL 1 MMOL/ML IV SOLN COMPARISON:  Abdominal ultrasound March 04, 2021 FINDINGS: Lower chest: No acute  abnormality. Hepatobiliary: Mild loss of signal on out of phase imaging consistent with hepatic steatosis. No suspicious hepatic lesions. Cholelithiasis without findings of acute cholecystitis. No biliary ductal dilation. There are few areas of extrinsic narrowing of the common duct related to crossing vessels but without definite filling defect visualized within the common bile duct to suggest choledocholithiasis. Pancreas: Normal intrinsic T1 signal of the pancreatic parenchyma. There are a few scattered tiny cystic lesions in the pancreas measuring 2-3 mm for instance a 2 mm lesion the body on image 19/4 and 3 mm cystic lesion in the tail on image 14/3. No pancreatic ductal dilation. No suspicious enhancing features. No arterially enhancing pancreatic lesions. Spleen:  Within normal limits in size and appearance. Adrenals/Urinary Tract: Bilateral adrenal glands are unremarkable. No hydronephrosis. No solid enhancing renal lesions. Stomach/Bowel: Visualized portions within the abdomen are unremarkable. Vascular/Lymphatic: No pathologically enlarged lymph nodes identified. No abdominal aortic aneurysm demonstrated. Other:  No abdominal ascites. Musculoskeletal: No suspicious bone lesions identified. IMPRESSION: 1. Cholelithiasis without findings of acute cholecystitis. 2. No biliary ductal dilation. No definite filling defect visualized within the common bile duct to suggest choledocholithiasis. 3. There are a few scattered tiny cystic lesions in the pancreas measuring 2-3 mm for instance a 2 mm lesion in the body and 3 mm in the tail. No suspicious enhancing features. No pancreatic ductal dilation. Findings are likely benign and may represent tiny side branch IPMN. Recommend follow-up MRI with and without contrast in 1 year to ensure stability. 4. Mild hepatic  steatosis. Electronically Signed   By: Maudry Mayhew MD   On: 03/05/2021 12:36   US Abdomen Limited RUQ (LIVER/GB)  Result Date: 03/04/2021 CLINICAL  DATA:  Right upper quadrant pain for 2 days EXAM: ULTRASOUND ABDOMEN LIMITED RIGHT UPPER QUADRANT COMPARISON:  None. FINDINGS: Gallbladder: Cholelithiasis with small stones in the dependent gallbladder. Largest stone measures 8 mm. No gallbladder wall thickening or edema. Murphy's sign is negative. Common bile duct: Diameter: 6 mm. Diameter is upper limits of normal but there appears to be an echogenic stone in the distal common bile duct. Liver: No focal lesion identified. Within normal limits in parenchymal echogenicity. Portal vein is patent on color Doppler imaging with normal direction of blood flow towards the liver. Other: None. IMPRESSION: Cholelithiasis. Probable common bile duct stone without obvious obstruction. Electronically Signed   By: Burman Nieves M.D.   On: 03/04/2021 18:10    Anti-infectives: Anti-infectives (From admission, onward)    Start     Dose/Rate Route Frequency Ordered Stop   03/06/21 0730  [MAR Hold]  ceFAZolin (ANCEF) IVPB 2g/100 mL premix        (MAR Hold since Fri 03/06/2021 at 0635.Hold Reason: Transfer to a Procedural area)   2 g 200 mL/hr over 30 Minutes Intravenous  Once 03/05/21 1435         Assessment/Plan: Choledocholithiasis - MRCP with no CBD stone, LFTs improving. Will proceed with lap chole/IOC this AM. Procedure, risks, and benefits discussed and she agrees.  LOS: 2 days    Violeta Gelinas, MD, MPH, FACS Trauma & General Surgery Use [email or web address] to contact on call provider  03/06/2021

## 2021-03-06 NOTE — Progress Notes (Signed)
[facility] Progress Note  Patient: Isabel Brewer    YQI:[id number]  DOA: 03/04/2021     Date of Service: the patient was seen and examined on 03/06/2021  Brief hospital course: Isabel Brewer is a 30 y.o. female with a history of PCOS, and G1P1 s/p preterm SVD 12/30/2020 at 35w who presented to [facility] ED with worsening abdominal pain starting as pressure in the epigastrium, turning to RUQ pain, nausea and vomiting. This episode was similar to prior episodes over the past several months but they've been getting longer and more severe. In the ED, U/S demonstrated cholelithiasis with high-normal CBD diameter 68mm and echogenic CBD stone without obvious obstruction. AST 436, ALT 198, TBili 3.7. ERCP was recommended, not available at [facility], so the patient was transferred to [facility] MRCP negative for any CBD stone.  Laparoscopic cholecystectomy. Currently plan is monitor postop recovery.  Assessment and Plan: 1.  Choledocholithiasis MRCP with no CBD stone.  LFT improving. Underwent lap chole. Diet and pain control per surgery.  2. Postpartum s/p preterm NSVD 12/30/2020, currently breastfeeding  Body mass index is 38.72 kg/m.   Diet: Per surgery DVT Prophylaxis:   SCDs Start: 03/05/21 0110    Advance goals of care discussion: Full code  Family Communication: no family was present at bedside, at the time of interview.  Disposition:  Status is: Inpatient  Remains inpatient appropriate because:Ongoing diagnostic testing needed not appropriate for outpatient work up  Dispo: The patient is from: Home              Anticipated d/c is to: Home              Patient currently is not medically stable to d/c.   Difficult to place patient No        Subjective: No nausea no vomiting.  No fever no chills or no chest pain.  Tolerated surgery very well.  Passing gas pain.  Not passing gas so far.  Physical Exam:  General: Appear in mild distress, no Rash; Oral Mucosa Clear, moist. no  Abnormal Neck Mass Or lumps, Conjunctiva normal  Cardiovascular: S1 and S2 Present, no Murmur, Respiratory: good respiratory effort, Bilateral Air entry present and CTA, no Crackles, no wheezes Abdomen: Bowel Sound present, Soft and mild tenderness Extremities: no Pedal edema Neurology: alert and oriented to time, place, and person affect appropriate. no new focal deficit Gait not checked due to patient safety concern   Vitals:   03/06/21 0928 03/06/21 0943 03/06/21 1013 03/06/21 1430  BP: 139/79 (!) 141/81 (!) 146/86 139/88  Pulse: 74 (!) 59 61 87  Resp: 13 14 20 20   Temp:  98.3 F (36.8 C) 98.1 F (36.7 C) 99.2 F (37.3 C)  TempSrc:   Oral Tympanic  SpO2: 100% 98% 100% 97%  Weight:      Height:        Intake/Output Summary (Last 24 hours) at 03/06/2021 1827 Last data filed at 03/06/2021 03/08/2021 Gross per 24 hour  Intake 2000.39 ml  Output 15 ml  Net 1985.39 ml   Filed Weights   03/04/21 2300  Weight: 122.4 kg    Data Reviewed: I have personally reviewed and interpreted daily labs, tele strips, imaging. I reviewed all nursing notes, pharmacy notes, vitals, pertinent old records I have discussed plan of care as described above with RN and patient/family.  CBC: Recent Labs  Lab 03/04/21 1436 03/05/21 0413  WBC 9.7 4.7  NEUTROABS  --  2.8  HGB 12.2 11.0*  HCT  36.9 34.0*  MCV 82.4 84.0  PLT 280 220   Basic Metabolic Panel: Recent Labs  Lab 03/04/21 1436 03/05/21 0413 03/06/21 0137  NA 139 139 138  K 4.3 3.5 3.5  CL 102 107 104  CO2 26 26 27   GLUCOSE 107* 104* 90  BUN 12 9 6   CREATININE 0.85 0.77 0.76  CALCIUM 9.6 8.9 9.1  MG  --  2.0  --     Studies: DG C-Arm 1-60 Min-No Report  Result Date: 03/06/2021 Fluoroscopy was utilized by the requesting physician.  No radiographic interpretation.    Scheduled Meds:  HYDROmorphone       pantoprazole (PROTONIX) IV  40 mg Intravenous Daily   Continuous Infusions: PRN Meds: acetaminophen **OR** acetaminophen,  oxyCODONE-acetaminophen **OR** morphine injection, ondansetron **OR** ondansetron (ZOFRAN) IV, polyethylene glycol  Time spent: 35 minutes  Author: 07-23-1972, MD [facility] 03/06/2021 6:27 PM  To reach On-call, see care teams to locate the attending and reach out via [email or web address]. Between 7PM-7AM, please contact night-coverage If you still have difficulty reaching the attending provider, please page the [facility] (Director on Call) for [facility] on amion for assistance.

## 2021-03-06 NOTE — Anesthesia Procedure Notes (Signed)
Procedure Name: Intubation Date/Time: 03/06/2021 7:41 AM Performed by: Trinna Post., CRNA Pre-anesthesia Checklist: Patient identified, Emergency Drugs available, Suction available, Patient being monitored and Timeout performed Patient Re-evaluated:Patient Re-evaluated prior to induction Oxygen Delivery Method: Circle system utilized Preoxygenation: Pre-oxygenation with 100% oxygen Induction Type: IV induction Ventilation: Mask ventilation without difficulty Laryngoscope Size: Mac and 3 Grade View: Grade I Tube type: Oral Tube size: 7.0 mm Number of attempts: 1 Airway Equipment and Method: Stylet Placement Confirmation: ETT inserted through vocal cords under direct vision, positive ETCO2 and breath sounds checked- equal and bilateral Secured at: 22 cm Tube secured with: Tape Dental Injury: Teeth and Oropharynx as per pre-operative assessment

## 2021-03-06 NOTE — Op Note (Signed)
  03/06/2021  9:03 AM  PATIENT:  Isabel Brewer  30 y.o. female  PRE-OPERATIVE DIAGNOSIS:  cholecysitits  POST-OPERATIVE DIAGNOSIS:  cholecysitits  PROCEDURE:  Procedure(s): LAPAROSCOPIC CHOLECYSTECTOMY WITH ATTEMPTED INTRAOPERATIVE CHOLANGIOGRAM  SURGEON:  Surgeon(s): Violeta Gelinas, MD  ASSISTANTS: none   ANESTHESIA:   local and general  EBL:  Total I/O In: 1000 [I.V.:1000] Out: -   BLOOD ADMINISTERED:none  DRAINS: none   SPECIMEN:  Excision  DISPOSITION OF SPECIMEN:  PATHOLOGY  COUNTS:  YES  DICTATION: .Dragon Dictation Findings: Inflamed gallbladder, unable to get a cholangiogram despite trying both a Cook and a Reddick catheter  Procedure in detail: Informed consent.  She received intravenous antibiotics.  She was brought the operating room and general endotracheal anesthesia was administered by the anesthesia staff.  Her abdomen was prepped and draped in sterile fashion.  We did a timeout procedure.The infraumbilical region was infiltrated with local. Infraumbilical incision was made. Subcutaneous tissues were dissected down revealing the anterior fascia. This was divided sharply along the midline. Peritoneal cavity was entered under direct vision without complication. A 0 Vicryl pursestring was placed around the fascial opening. Hassan trocar was inserted into the abdomen. The abdomen was insufflated with carbon dioxide in standard fashion. Under direct vision a 5 mm epigastric port and 2 5 mm right-sided ports were placed.  Local was used at each port site.  Laparoscopic exploration revealed some acute inflammation of the gallbladder with adherent omentum.  This was gradually swept away revealing the body and the infundibulum of the gallbladder.  The dome was retracted superior medially and the infundibulum was retracted inferior laterally.  Dissection began laterally and progressed medially first identifying the cystic artery.  This was clipped twice proximally once  distally and divided.  Next the cystic duct was clearly dissected until we had a critical view of safety.  A clip was placed on the infundibular cystic duct junction.  A small nick was made in the cystic duct and we inserted a Cook cholangiogram catheter.  Unfortunately I could not get contrast to go through the catheter very well despite multiple attempts.  I switched to a Reddick catheter but again could not get a cholangiogram done.  After multiple attempts this was abandoned.  3 clips were placed proximal on the cystic duct and it was divided.  The gallbladder was taken off the liver bed using cautery and achieving good hemostasis.  The gallbladder was placed in a bag and removed from the abdomen.  It was sent to pathology.  The liver bed was irrigated and cauterized to get excellent hemostasis.  Clips remain in good position.  Irrigation was evacuated.  Ports were removed under direct vision.  Pneumoperitoneum was released.  The infraumbilical fascia was closed by tying the pursestring.  All 4 wounds were irrigated and the skin of each was closed with 4-0 Vicryl followed by Dermabond.  All counts were correct.  She tolerated the procedure well without apparent complication and was taken recovery in stable condition.  PATIENT DISPOSITION:  PACU - hemodynamically stable.   Delay start of Pharmacological VTE agent (>24hrs) due to surgical blood loss or risk of bleeding:  no  Violeta Gelinas, MD, MPH, FACS Pager: [phone]  6/17/20229:03 AM

## 2021-03-06 NOTE — Anesthesia Preprocedure Evaluation (Signed)
Anesthesia Evaluation  Patient identified by MRN, date of birth, ID band Patient awake    Reviewed: Allergy & Precautions, H&P , NPO status , Patient's Chart, lab work & pertinent test results  Airway Mallampati: III  TM Distance: >3 FB Neck ROM: full    Dental no notable dental hx.    Pulmonary neg pulmonary ROS, asthma ,    Pulmonary exam normal        Cardiovascular hypertension, negative cardio ROS Normal cardiovascular exam     Neuro/Psych negative neurological ROS  negative psych ROS   GI/Hepatic negative GI ROS, Neg liver ROS, GERD  ,  Endo/Other  negative endocrine ROS  Renal/GU negative Renal ROS  negative genitourinary   Musculoskeletal negative musculoskeletal ROS (+)   Abdominal   Peds negative pediatric ROS (+)  Hematology negative hematology ROS (+)   Anesthesia Other Findings   Reproductive/Obstetrics negative OB ROS                             Anesthesia Physical  Anesthesia Plan  ASA: II  Anesthesia Plan: General   Post-op Pain Management:    Induction: Intravenous  PONV Risk Score and Plan: 3 and Ondansetron, Dexamethasone, Midazolam and Treatment may vary due to age or medical condition  Airway Management Planned: Oral ETT  Additional Equipment:   Intra-op Plan:   Post-operative Plan: Extubation in OR  Informed Consent: I have reviewed the patients History and Physical, chart, labs and discussed the procedure including the risks, benefits and alternatives for the proposed anesthesia with the patient or authorized representative who has indicated his/her understanding and acceptance.     Dental Advisory Given  Plan Discussed with: CRNA and Anesthesiologist  Anesthesia Plan Comments:         Anesthesia Quick Evaluation

## 2021-03-07 ENCOUNTER — Encounter (HOSPITAL_COMMUNITY): Payer: Self-pay | Admitting: General Surgery

## 2021-03-07 LAB — CBC WITH DIFFERENTIAL/PLATELET
Abs Immature Granulocytes: 0.05 10*3/uL (ref 0.00–0.07)
Basophils Absolute: 0 10*3/uL (ref 0.0–0.1)
Basophils Relative: 0 %
Eosinophils Absolute: 0 10*3/uL (ref 0.0–0.5)
Eosinophils Relative: 0 %
HCT: 33.7 % — ABNORMAL LOW (ref 36.0–46.0)
Hemoglobin: 10.8 g/dL — ABNORMAL LOW (ref 12.0–15.0)
Immature Granulocytes: 1 %
Lymphocytes Relative: 21 %
Lymphs Abs: 1.9 10*3/uL (ref 0.7–4.0)
MCH: 27.2 pg (ref 26.0–34.0)
MCHC: 32 g/dL (ref 30.0–36.0)
MCV: 84.9 fL (ref 80.0–100.0)
Monocytes Absolute: 0.8 10*3/uL (ref 0.1–1.0)
Monocytes Relative: 8 %
Neutro Abs: 6.7 10*3/uL (ref 1.7–7.7)
Neutrophils Relative %: 70 %
Platelets: 269 10*3/uL (ref 150–400)
RBC: 3.97 MIL/uL (ref 3.87–5.11)
RDW: 13.9 % (ref 11.5–15.5)
WBC: 9.5 10*3/uL (ref 4.0–10.5)
nRBC: 0 % (ref 0.0–0.2)

## 2021-03-07 LAB — COMPREHENSIVE METABOLIC PANEL
ALT: 129 U/L — ABNORMAL HIGH (ref 0–44)
AST: 56 U/L — ABNORMAL HIGH (ref 15–41)
Albumin: 3.4 g/dL — ABNORMAL LOW (ref 3.5–5.0)
Alkaline Phosphatase: 84 U/L (ref 38–126)
Anion gap: 7 (ref 5–15)
BUN: 5 mg/dL — ABNORMAL LOW (ref 6–20)
CO2: 27 mmol/L (ref 22–32)
Calcium: 9 mg/dL (ref 8.9–10.3)
Chloride: 103 mmol/L (ref 98–111)
Creatinine, Ser: 0.79 mg/dL (ref 0.44–1.00)
GFR, Estimated: >60 mL/min (ref >60)
Glucose, Bld: 106 mg/dL — ABNORMAL HIGH (ref 70–99)
Potassium: 3.3 mmol/L — ABNORMAL LOW (ref 3.5–5.1)
Sodium: 137 mmol/L (ref 135–145)
Total Bilirubin: 1.2 mg/dL (ref 0.3–1.2)
Total Protein: 6.6 g/dL (ref 6.5–8.1)

## 2021-03-07 MED ORDER — OXYCODONE-ACETAMINOPHEN 5-325 MG PO TABS: 1.0000 | ORAL_TABLET | ORAL | 0 refills | Status: AC | PRN

## 2021-03-07 NOTE — Discharge Summary (Signed)
Physician Discharge Summary  Patient ID: Isabel Brewer MRN: [id number] DOB/AGE: 12/21/1990 30 y.o.  PCP: Patient, No Pcp Per (Inactive)  Admit date: 03/04/2021 Discharge date: 03/07/2021  Admission Diagnoses:  cholecystitis  Discharge Diagnoses:  same  Principal Problem:   Choledocholithiasis   Surgery:  laparoscopic cholecystectomy 03/06/21  Discharged Condition: improved  Hospital Course:   admitted and had negative MRCP.  Lap chole with attempted IOC performed by Dr. Janee Morn.  Kept overnight and doing well and ready for discharge.  She is breast feeding and will use  oxycodone for pain.  Today's lab showed normal bilirubin level and transaminases trending lower.    Consults: GI  Significant Diagnostic Studies: path pending    Discharge Exam: Blood pressure (!) 148/84, pulse 83, temperature 97.6 F (36.4 C), temperature source Oral, resp. rate 18, height 5\' 10"  (1.778 m), weight 122.4 kg, SpO2 100 %, currently breastfeeding. Incisions OK  Disposition: Discharge disposition: 01-Home or Self Care       Discharge Instructions     Call MD for:  redness, tenderness, or signs of infection (pain, swelling, redness, odor or green/yellow discharge around incision site)   Complete by: As directed    Diet - low sodium heart healthy   Complete by: As directed    Discharge instructions   Complete by: As directed    May shower when you get home   Increase activity slowly   Complete by: As directed       Allergies as of 03/07/2021   No Known Allergies      Medication List     TAKE these medications    ibuprofen 600 MG tablet Commonly known as: ADVIL Take 1 tablet (600 mg total) by mouth every 6 (six) hours. What changed:  when to take this reasons to take this   multivitamin-prenatal 27-0.8 MG Tabs tablet Take 1 tablet by mouth daily at 12 noon.   oxyCODONE-acetaminophen 5-325 MG tablet Commonly known as: PERCOCET/ROXICET Take 1 tablet by mouth every  4 (four) hours as needed for moderate pain.       ASK your doctor about these medications    benzocaine-Menthol 20-0.5 % Aero Commonly known as: DERMOPLAST Apply 1 application topically as needed for irritation (perineal discomfort).   coconut oil Oil Apply 1 application topically as needed.   docusate sodium 100 MG capsule Commonly known as: COLACE Take 1 capsule (100 mg total) by mouth 2 (two) times daily.   ferrous sulfate 325 (65 FE) MG tablet Take 1 tablet (325 mg total) by mouth 2 (two) times daily with a meal.   simethicone 80 MG chewable tablet Commonly known as: MYLICON Chew 1 tablet (80 mg total) by mouth as needed for flatulence.   witch hazel-glycerin pad Commonly known as: TUCKS Apply 1 application topically as needed for hemorrhoids.        Follow-up Information     03/09/2021, MD. Schedule an appointment as soon as possible for a visit in 3 week(s).   Specialty: General Surgery Why: As needed Contact information: [address] [place] [phone]                 Signed: [phone] 03/07/2021, 8:22 AM

## 2021-03-07 NOTE — Progress Notes (Signed)
Patient discharged to home prior to my ability to round on her this morning. Discharge plan and instructions per general surgery. Discharge summary completed by general surgery.   Teddy Spike, DO

## 2021-03-07 NOTE — Plan of Care (Signed)
  Problem: Pain Managment: Goal: General experience of comfort will improve Outcome: Adequate for Discharge   Problem: Education: Goal: Knowledge of General Education information will improve Description: Including pain rating scale, medication(s)/side effects and non-pharmacologic comfort measures Outcome: Adequate for Discharge   Problem: Health Behavior/Discharge Planning: Goal: Ability to manage health-related needs will improve Outcome: Adequate for Discharge   Problem: Clinical Measurements: Goal: Ability to maintain clinical measurements within normal limits will improve Outcome: Adequate for Discharge Goal: Will remain free from infection Outcome: Adequate for Discharge Goal: Diagnostic test results will improve Outcome: Adequate for Discharge Goal: Respiratory complications will improve Outcome: Adequate for Discharge Goal: Cardiovascular complication will be avoided Outcome: Adequate for Discharge   Problem: Activity: Goal: Risk for activity intolerance will decrease Outcome: Adequate for Discharge   Problem: Nutrition: Goal: Adequate nutrition will be maintained Outcome: Adequate for Discharge   Problem: Coping: Goal: Level of anxiety will decrease Outcome: Adequate for Discharge   Problem: Elimination: Goal: Will not experience complications related to bowel motility Outcome: Adequate for Discharge Goal: Will not experience complications related to urinary retention Outcome: Adequate for Discharge   Problem: Safety: Goal: Ability to remain free from injury will improve Outcome: Adequate for Discharge   Problem: Skin Integrity: Goal: Risk for impaired skin integrity will decrease Outcome: Adequate for Discharge

## 2021-03-09 LAB — SURGICAL PATHOLOGY

## 2022-05-12 IMAGING — MR MR ABDOMEN WO/W CM MRCP
12 of 21 series · 22 of 48 positions shown · IV contrast (G GAD)
Comparison: Abdominal ultrasound March 04, 2021

CLINICAL DATA: Abdominal pain with nausea and vomiting, concern for
choledocholithiasis seen on prior ultrasound.

EXAM:
MRI ABDOMEN WITHOUT AND WITH CONTRAST (INCLUDING MRCP)
TECHNIQUE: Multiplanar multisequence MR imaging of the abdomen was performed
both before and after the administration of intravenous contrast.
Heavily T2-weighted images of the biliary and pancreatic ducts were
obtained, and three-dimensional MRCP images were rendered by post
processing.
CONTRAST:  10mL GADAVIST GADOBUTROL 1 MMOL/ML IV SOLN

[Series 3: cor ssfse nav · coronal · 6.0mm · 0.78mm/px · 1 of 34 slices shown]
[im 1/34]
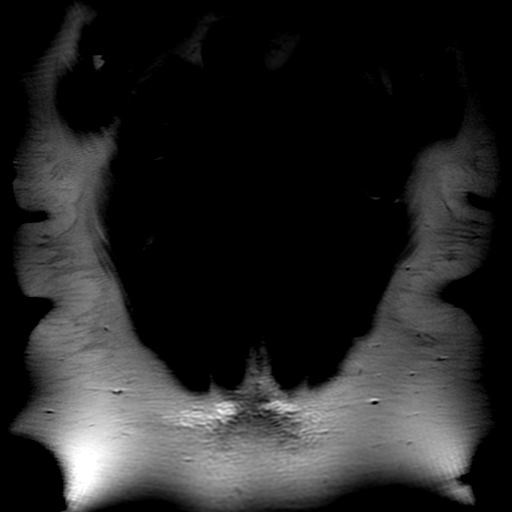

[Series 4: ax ssfse nav · axial · 6.0mm · 0.74mm/px · 1 of 42 slices shown]
[im 1/42]
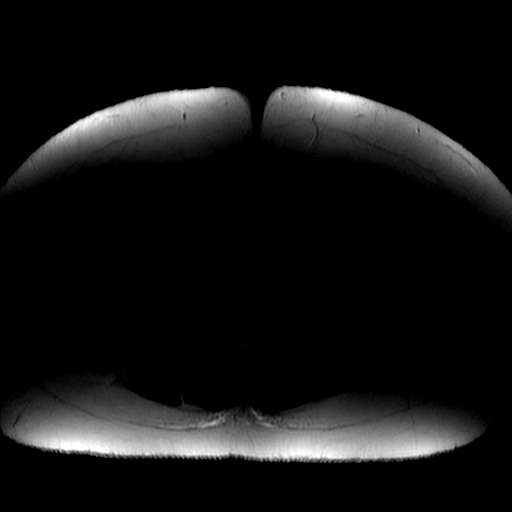

[Series 5: T2 fat-sat · axial · 6.0mm · 0.74mm/px · 1 of 44 slices shown]
[im 1/44]
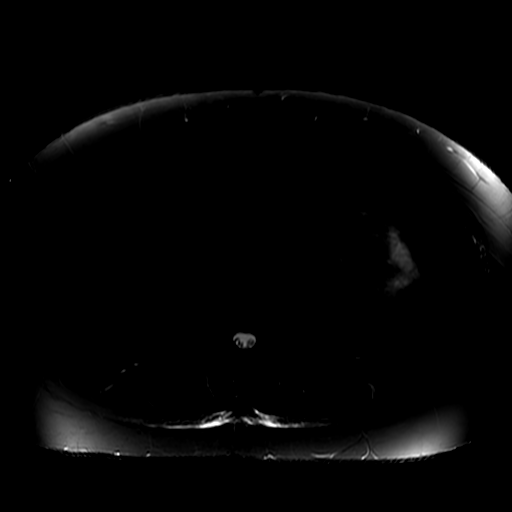

[Series 6: DWI b500 · axial · 8.0mm · 1.48mm/px · 1 of 64 slices shown]
[im 1/64]
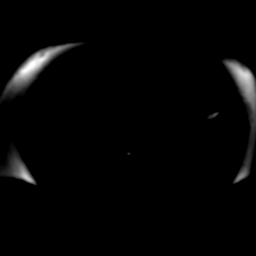

[Series 8: T1 dynamic · axial · 5.0mm · 0.82mm/px · z∈[-135,+143]mm · 3 of 112 slices shown (1 of 4)]
[im 1/112]
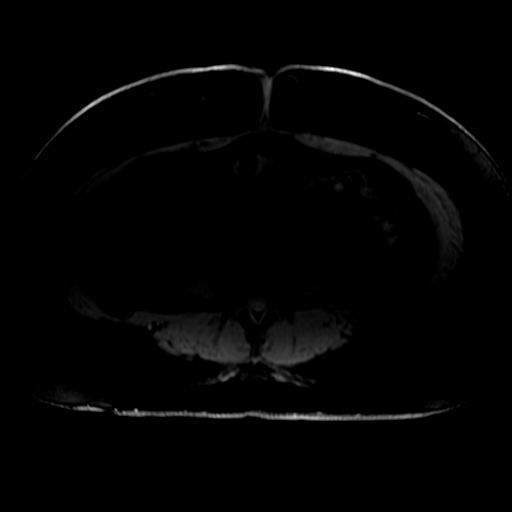
[im 56/112]
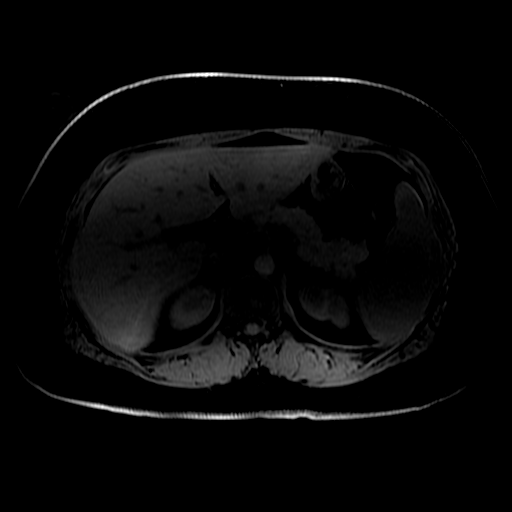
[im 112/112]
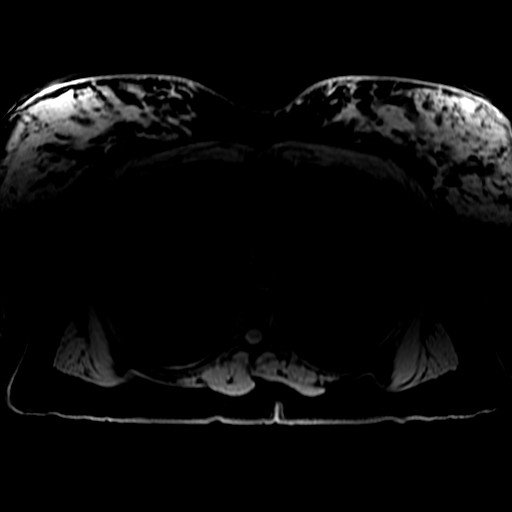

[Series 9: radial 2d thick · sagittal · 40.0mm · 0.86mm/px · 1 of 6 slices shown]
[im 1/6]
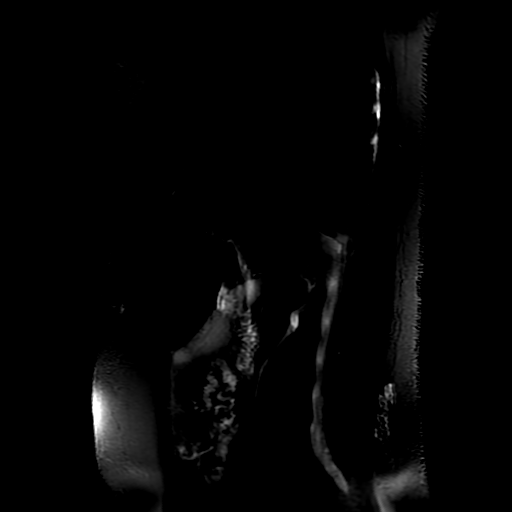

[Series 10: bSSFP · coronal · 6.0mm · 0.78mm/px · 1 of 32 slices shown]
[im 1/32]
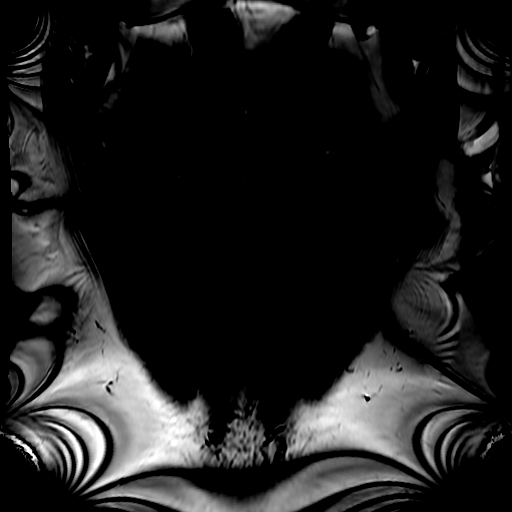

[Series 12: T1 dynamic · coronal · 3.4mm · 1.56mm/px · 3 of 120 slices shown (2 of 4)]
[im 1/120]
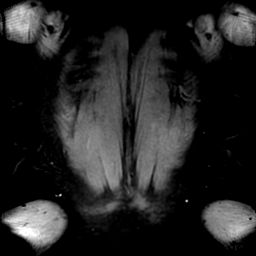
[im 60/120]
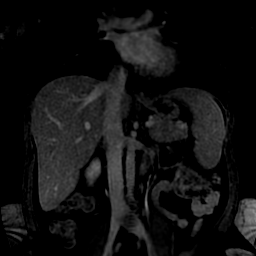
[im 120/120]
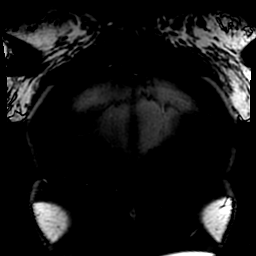

[Series 650: ADC · axial · 8.0mm · 1.48mm/px · 1 of 32 slices shown]
[im 1/32]
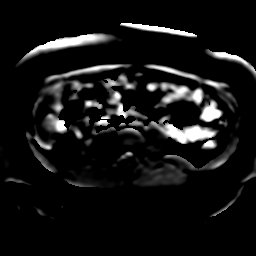

[Series 801: T1 dynamic · axial · 5.0mm · 0.82mm/px · z∈[-135,+143]mm · 3 of 112 slices shown (3 of 4)]
[im 1/112]
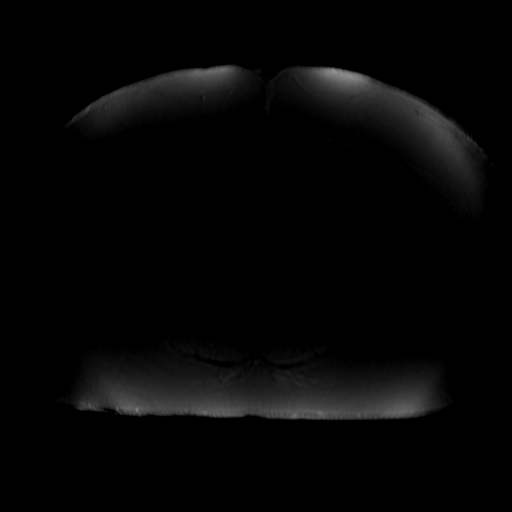
[im 56/112]
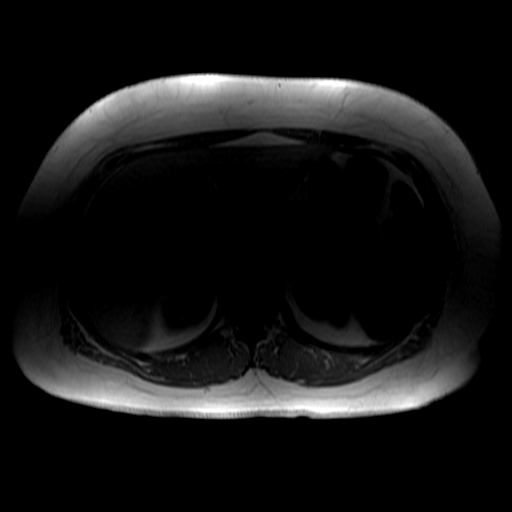
[im 112/112]
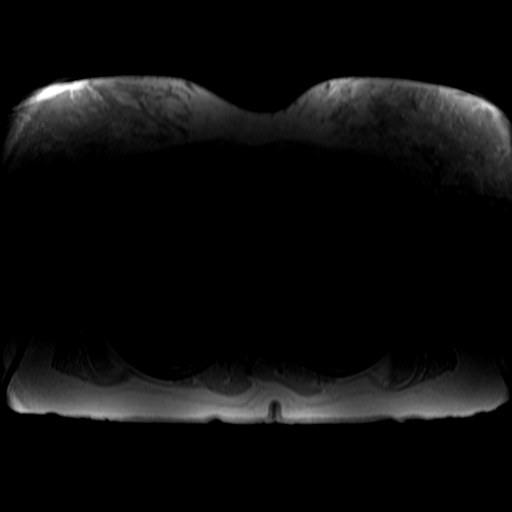

[Series 802: T1 dynamic · axial · 5.0mm · 0.82mm/px · z∈[-135,+143]mm · 3 of 112 slices shown (4 of 4)]
[im 1/112]
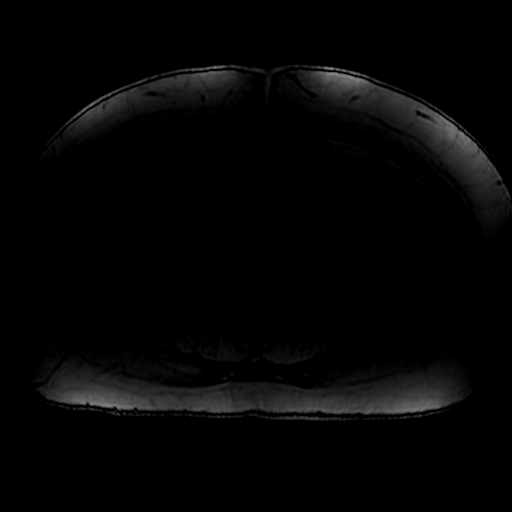
[im 56/112]
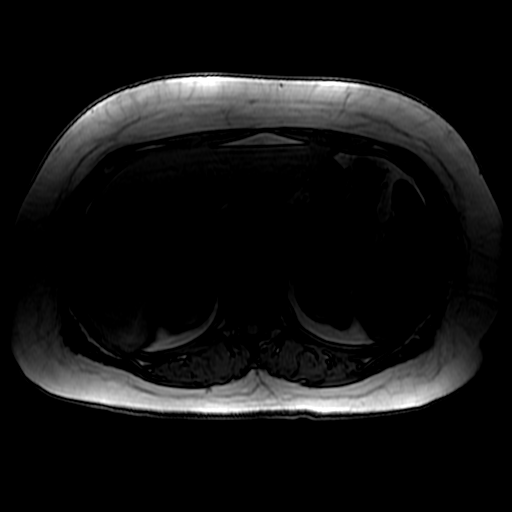
[im 112/112]
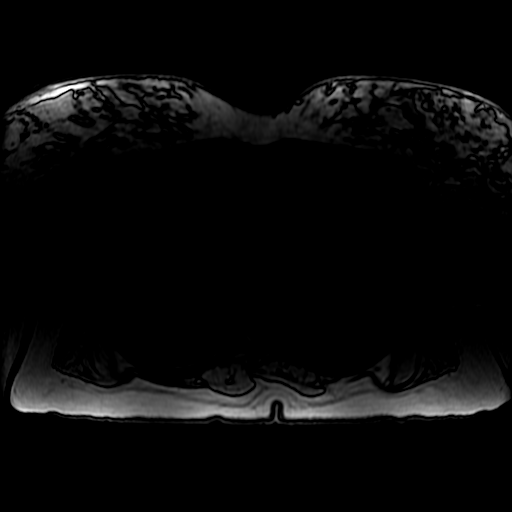

[Series 1100: T1 dynamic post-contrast · axial · non-contrast · 3.9mm · 0.82mm/px · z∈[-134,+136]mm · 3 of 136 slices shown]
[im 1/136]
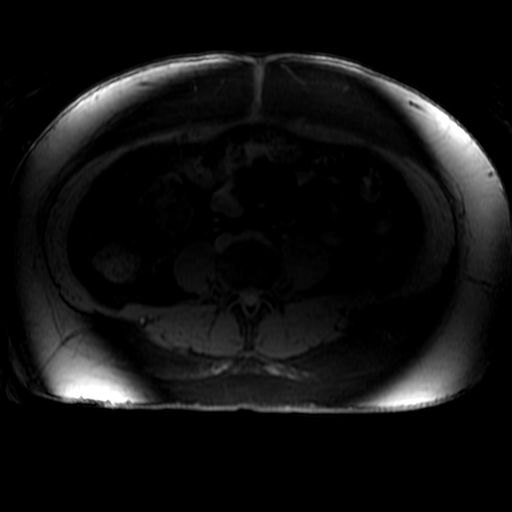
[im 68/136]
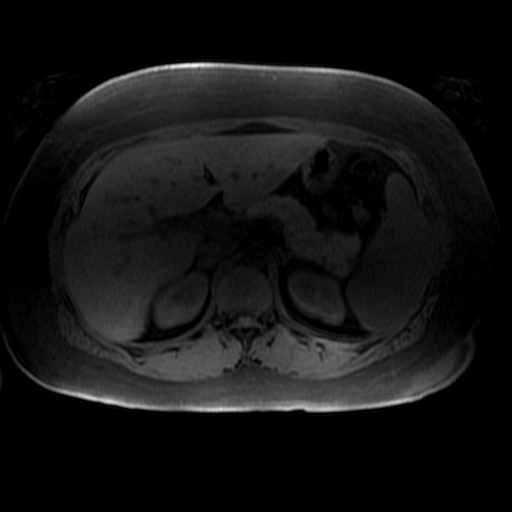
[im 136/136]
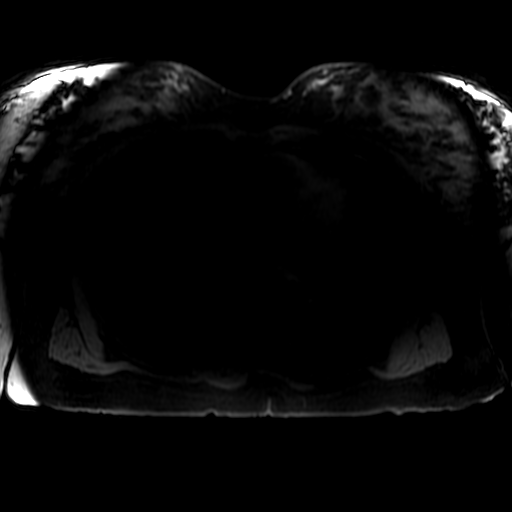

[22 of 48 positions shown; findings below may reference images not displayed]

FINDINGS: Lower chest: No acute abnormality.

Hepatobiliary: Mild loss of signal on out of phase imaging
consistent with hepatic steatosis. No suspicious hepatic lesions.
Cholelithiasis without findings of acute cholecystitis. No biliary
ductal dilation. There are few areas of extrinsic narrowing of the
common duct related to crossing vessels but without definite filling
defect visualized within the common bile duct to suggest
choledocholithiasis.

Pancreas: Normal intrinsic T1 signal of the pancreatic parenchyma.
There are a few scattered tiny cystic lesions in the pancreas
measuring 2-3 mm for instance a 2 mm lesion the body on image [DATE]
and 3 mm cystic lesion in the tail on image [DATE]. No pancreatic
ductal dilation. No suspicious enhancing features. No arterially
enhancing pancreatic lesions.

Spleen:  Within normal limits in size and appearance.

Adrenals/Urinary Tract: Bilateral adrenal glands are unremarkable.
No hydronephrosis. No solid enhancing renal lesions.

Stomach/Bowel: Visualized portions within the abdomen are
unremarkable.

Vascular/Lymphatic: No pathologically enlarged lymph nodes
identified. No abdominal aortic aneurysm demonstrated.

Other:  No abdominal ascites.

Musculoskeletal: No suspicious bone lesions identified.
IMPRESSION: 1. Cholelithiasis without findings of acute cholecystitis.
2. No biliary ductal dilation. No definite filling defect visualized
within the common bile duct to suggest choledocholithiasis.
3. There are a few scattered tiny cystic lesions in the pancreas
measuring 2-3 mm for instance a 2 mm lesion in the body and 3 mm in
the tail. No suspicious enhancing features. No pancreatic ductal
dilation. Findings are likely benign and may represent tiny side
branch IPMN. Recommend follow-up MRI with and without contrast in 1
year to ensure stability.
4. Mild hepatic steatosis.

## 2023-12-05 ENCOUNTER — Other Ambulatory Visit: Payer: Self-pay

## 2023-12-05 ENCOUNTER — Emergency Department
Admission: EM | Admit: 2023-12-05 | Discharge: 2023-12-05 | Disposition: A | Discharge status: Home / Self Care | Attending: Emergency Medicine | Admitting: Emergency Medicine

## 2023-12-05 DIAGNOSIS — N3000 Acute cystitis without hematuria: Secondary | ICD-10-CM | POA: Diagnosis not present

## 2023-12-05 DIAGNOSIS — D72829 Elevated white blood cell count, unspecified: Secondary | ICD-10-CM | POA: Insufficient documentation

## 2023-12-05 DIAGNOSIS — R109 Unspecified abdominal pain: Secondary | ICD-10-CM | POA: Diagnosis present

## 2023-12-05 LAB — URINALYSIS, ROUTINE W REFLEX MICROSCOPIC
Bilirubin Urine: NEGATIVE
Glucose, UA: NEGATIVE mg/dL
Ketones, ur: NEGATIVE mg/dL
Nitrite: NEGATIVE
Protein, ur: 100 mg/dL — AB
Specific Gravity, Urine: 1.004 — ABNORMAL LOW (ref 1.005–1.030)
WBC, UA: >50 WBC/hpf (ref 0–5)
pH: 6 (ref 5.0–8.0)

## 2023-12-05 LAB — CBC
HCT: 38.2 % (ref 36.0–46.0)
Hemoglobin: 13.1 g/dL (ref 12.0–15.0)
MCH: 30.2 pg (ref 26.0–34.0)
MCHC: 34.3 g/dL (ref 30.0–36.0)
MCV: 88 fL (ref 80.0–100.0)
Platelets: 242 10*3/uL (ref 150–400)
RBC: 4.34 MIL/uL (ref 3.87–5.11)
RDW: 12.2 % (ref 11.5–15.5)
WBC: 14.5 10*3/uL — ABNORMAL HIGH (ref 4.0–10.5)
nRBC: 0 % (ref 0.0–0.2)

## 2023-12-05 LAB — COMPREHENSIVE METABOLIC PANEL
ALT: 18 U/L (ref 0–44)
AST: 23 U/L (ref 15–41)
Albumin: 3.9 g/dL (ref 3.5–5.0)
Alkaline Phosphatase: 42 U/L (ref 38–126)
Anion gap: 8 (ref 5–15)
BUN: 12 mg/dL (ref 6–20)
CO2: 22 mmol/L (ref 22–32)
Calcium: 9.2 mg/dL (ref 8.9–10.3)
Chloride: 107 mmol/L (ref 98–111)
Creatinine, Ser: 0.65 mg/dL (ref 0.44–1.00)
GFR, Estimated: >60 mL/min (ref >60)
Glucose, Bld: 111 mg/dL — ABNORMAL HIGH (ref 70–99)
Potassium: 3.8 mmol/L (ref 3.5–5.1)
Sodium: 137 mmol/L (ref 135–145)
Total Bilirubin: 0.7 mg/dL (ref 0.0–1.2)
Total Protein: 7.3 g/dL (ref 6.5–8.1)

## 2023-12-05 LAB — PREGNANCY, URINE: Preg Test, Ur: NEGATIVE

## 2023-12-05 LAB — LIPASE, BLOOD: Lipase: 29 U/L (ref 11–51)

## 2023-12-05 MED ORDER — CEPHALEXIN 500 MG PO CAPS
500.0000 mg | ORAL_CAPSULE | Freq: Once | ORAL | Status: AC
  Administered 2023-12-05: 500 mg via ORAL
  Filled 2023-12-05: qty 1

## 2023-12-05 MED ORDER — CEPHALEXIN 500 MG PO CAPS: 500.0000 mg | ORAL_CAPSULE | Freq: Four times a day (QID) | ORAL | 0 refills | Status: AC

## 2023-12-05 NOTE — ED Provider Notes (Signed)
 [facility] Provider Note    Event Date/Time   First MD Initiated Contact with Patient 12/05/23 0222     (approximate)   History   Abdominal Pain   HPI  Isabel Brewer is a 33 year old with history of PCOS female presenting to the emergency department for evaluation of abdominal pain.  For last several days she has had intermittent abdominal cramps.  Feels somewhat similar to menstrual cramps, but more severe.  Does report some associated nausea without vomiting.  Has been taking Tylenol and ibuprofen with limited benefit.  Additionally reports a tingling sensation when peeing.  Is not menstruating, but anticipates that she will start her period soon.     Physical Exam   Triage Vital Signs: ED Triage Vitals  Encounter Vitals Group     BP 12/05/23 0216 (!) 148/82     Systolic BP Percentile --      Diastolic BP Percentile --      Pulse Rate 12/05/23 0216 96     Resp 12/05/23 0216 20     Temp 12/05/23 0216 98.4 F (36.9 C)     Temp Source 12/05/23 0216 Oral     SpO2 12/05/23 0216 100 %     Weight 12/05/23 0217 243 lb (110.2 kg)     Height 12/05/23 0217 5\' 10"  (1.778 m)     Head Circumference --      Peak Flow --      Pain Score 12/05/23 0216 5     Pain Loc --      Pain Education --      Exclude from Growth Chart --     Most recent vital signs: Vitals:   12/05/23 0216  BP: (!) 148/82  Pulse: 96  Resp: 20  Temp: 98.4 F (36.9 C)  SpO2: 100%     General: Awake, interactive  CV:  Regular rate, good peripheral perfusion.  Resp:  Unlabored respirations, lungs clear to auscultation Abd:  Nondistended, soft, no significant tenderness to palpation Back:  Mild tenderness over the lumbar paraspinous musculature, negative CVA tenderness Neuro:  Symmetric facial movement, fluid speech   ED Results / Procedures / Treatments   Labs (all labs ordered are listed, but only abnormal results are displayed) Labs Reviewed  COMPREHENSIVE  METABOLIC PANEL - Abnormal; Notable for the following components:      Result Value   Glucose, Bld 111 (*)    All other components within normal limits  CBC - Abnormal; Notable for the following components:   WBC 14.5 (*)    All other components within normal limits  URINALYSIS, ROUTINE W REFLEX MICROSCOPIC - Abnormal; Notable for the following components:   Color, Urine YELLOW (*)    APPearance CLOUDY (*)    Specific Gravity, Urine 1.004 (*)    Hgb urine dipstick LARGE (*)    Protein, ur 100 (*)    Leukocytes,Ua LARGE (*)    Bacteria, UA RARE (*)    All other components within normal limits  URINE CULTURE  LIPASE, BLOOD  PREGNANCY, URINE  POC URINE PREG, ED     EKG EKG independently reviewed interpreted by myself (ER attending) demonstrates:    RADIOLOGY Imaging independently reviewed and interpreted by myself demonstrates:    PROCEDURES:  Critical Care performed: No  Procedures   MEDICATIONS ORDERED IN ED: Medications  cephALEXin (KEFLEX) capsule 500 mg (has no administration in time range)     IMPRESSION / MDM / ASSESSMENT AND PLAN / ED COURSE  I reviewed the triage vital signs and the nursing notes.  Differential diagnosis includes, but is not limited to, UTI, viral GI illness, abdominal cramping related to menstruation, very low suspicion ovarian torsion, acute intra-abdominal process given reassuring exam  Patient's presentation is most consistent with acute presentation with potential threat to life or bodily function.  33 year old female presenting with abdominal cramping.  Stable vitals on exam.  Will obtain labs, urine to further evaluate.  Based on exam, do not think imaging indicated at this time.  4:39 AM Labs notable for leukocytosis with WC of 14.5.  Urine is concerning for infection with greater than 50 white blood cells, large leukocyte esterase.  CMP and lipase reassuring.  Patient reassessed.  Remains without new complaints.  Discussed results  of workup.  She is comfortable with discharge with antibiotics for her UTI.  Does report some back pain that I suspect is more musculoskeletal, but will DC with 10-day course of Keflex for pyelonephritis coverage.  Strict return precautions provided.  Patient discharged in stable condition.      FINAL CLINICAL IMPRESSION(S) / ED DIAGNOSES   Final diagnoses:  Acute cystitis without hematuria     Rx / DC Orders   ED Discharge Orders          Ordered    cephALEXin (KEFLEX) 500 MG capsule  4 times daily        12/05/23 0438             Note:  This document was prepared using Dragon voice recognition software and may include unintentional dictation errors.   Trinna Post, MD 12/05/23 [phone]

## 2023-12-05 NOTE — ED Triage Notes (Signed)
 Pt arrived POV for abd pain like cramping sensation, felt as it was menstrual cramping that now radiates into the mid lower back. Some nausea, constipation, but denies vomiting. Also staring it "tickles, weird sensation when urinating", but not painful, and reports urine is "foamy". NAD noted, A&O x4, VSS.

## 2023-12-05 NOTE — Discharge Instructions (Signed)
 You are seen in the ER today for abdominal discomfort.  Your testing did show that you have a urinary tract infection.  Please take your antibiotics as directed.  If your symptoms are not improving within a few days, follow with your primary care doctor return to the ER.  If you have worsening of your symptoms or for any other new or concerning symptoms return to the ER.

## 2023-12-07 LAB — URINE CULTURE: Culture: 60000 — AB
